# Patient Record
Sex: Female | Born: 2000 | Race: Black or African American | Hispanic: No | Marital: Single | Smoking: Never smoker
Health system: Southern US, Community
[De-identification: ages and names within clinical notes are randomized; demographics above are authoritative.]

## PROBLEM LIST (undated history)

## (undated) DIAGNOSIS — J45909 Unspecified asthma, uncomplicated: Secondary | ICD-10-CM

## (undated) DIAGNOSIS — K219 Gastro-esophageal reflux disease without esophagitis: Secondary | ICD-10-CM

## (undated) DIAGNOSIS — J302 Other seasonal allergic rhinitis: Secondary | ICD-10-CM

## (undated) HISTORY — PX: BACK SURGERY: SHX140

---
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
## 2020-01-01 IMAGING — CR DG CHEST 2V
2 series · 2 of 2 positions shown · non-contrast
Comparison: Radiograph July 27, 2018

CLINICAL DATA: Chest pain

EXAM:
CHEST - 2 VIEW

[chest pa]
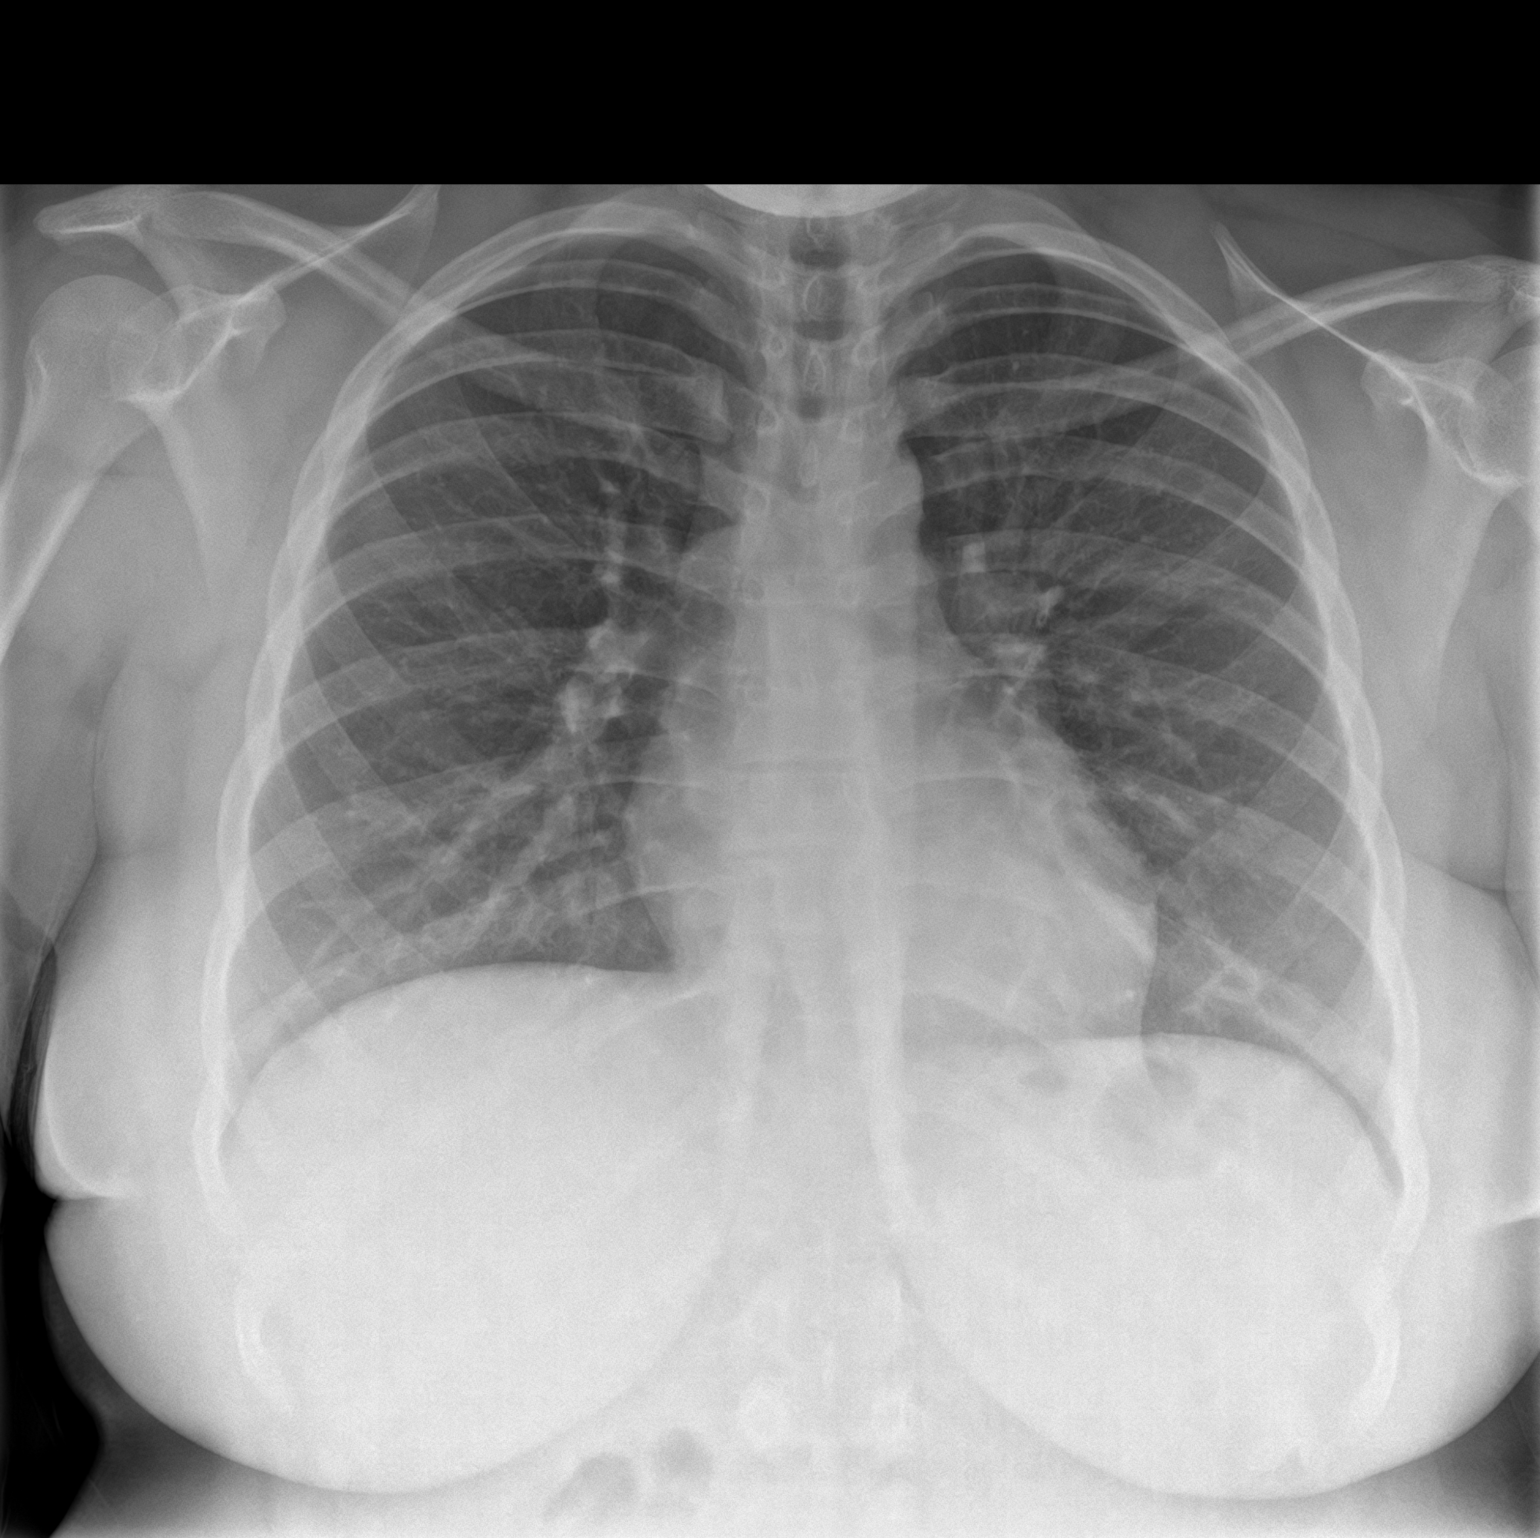

[chest lat]
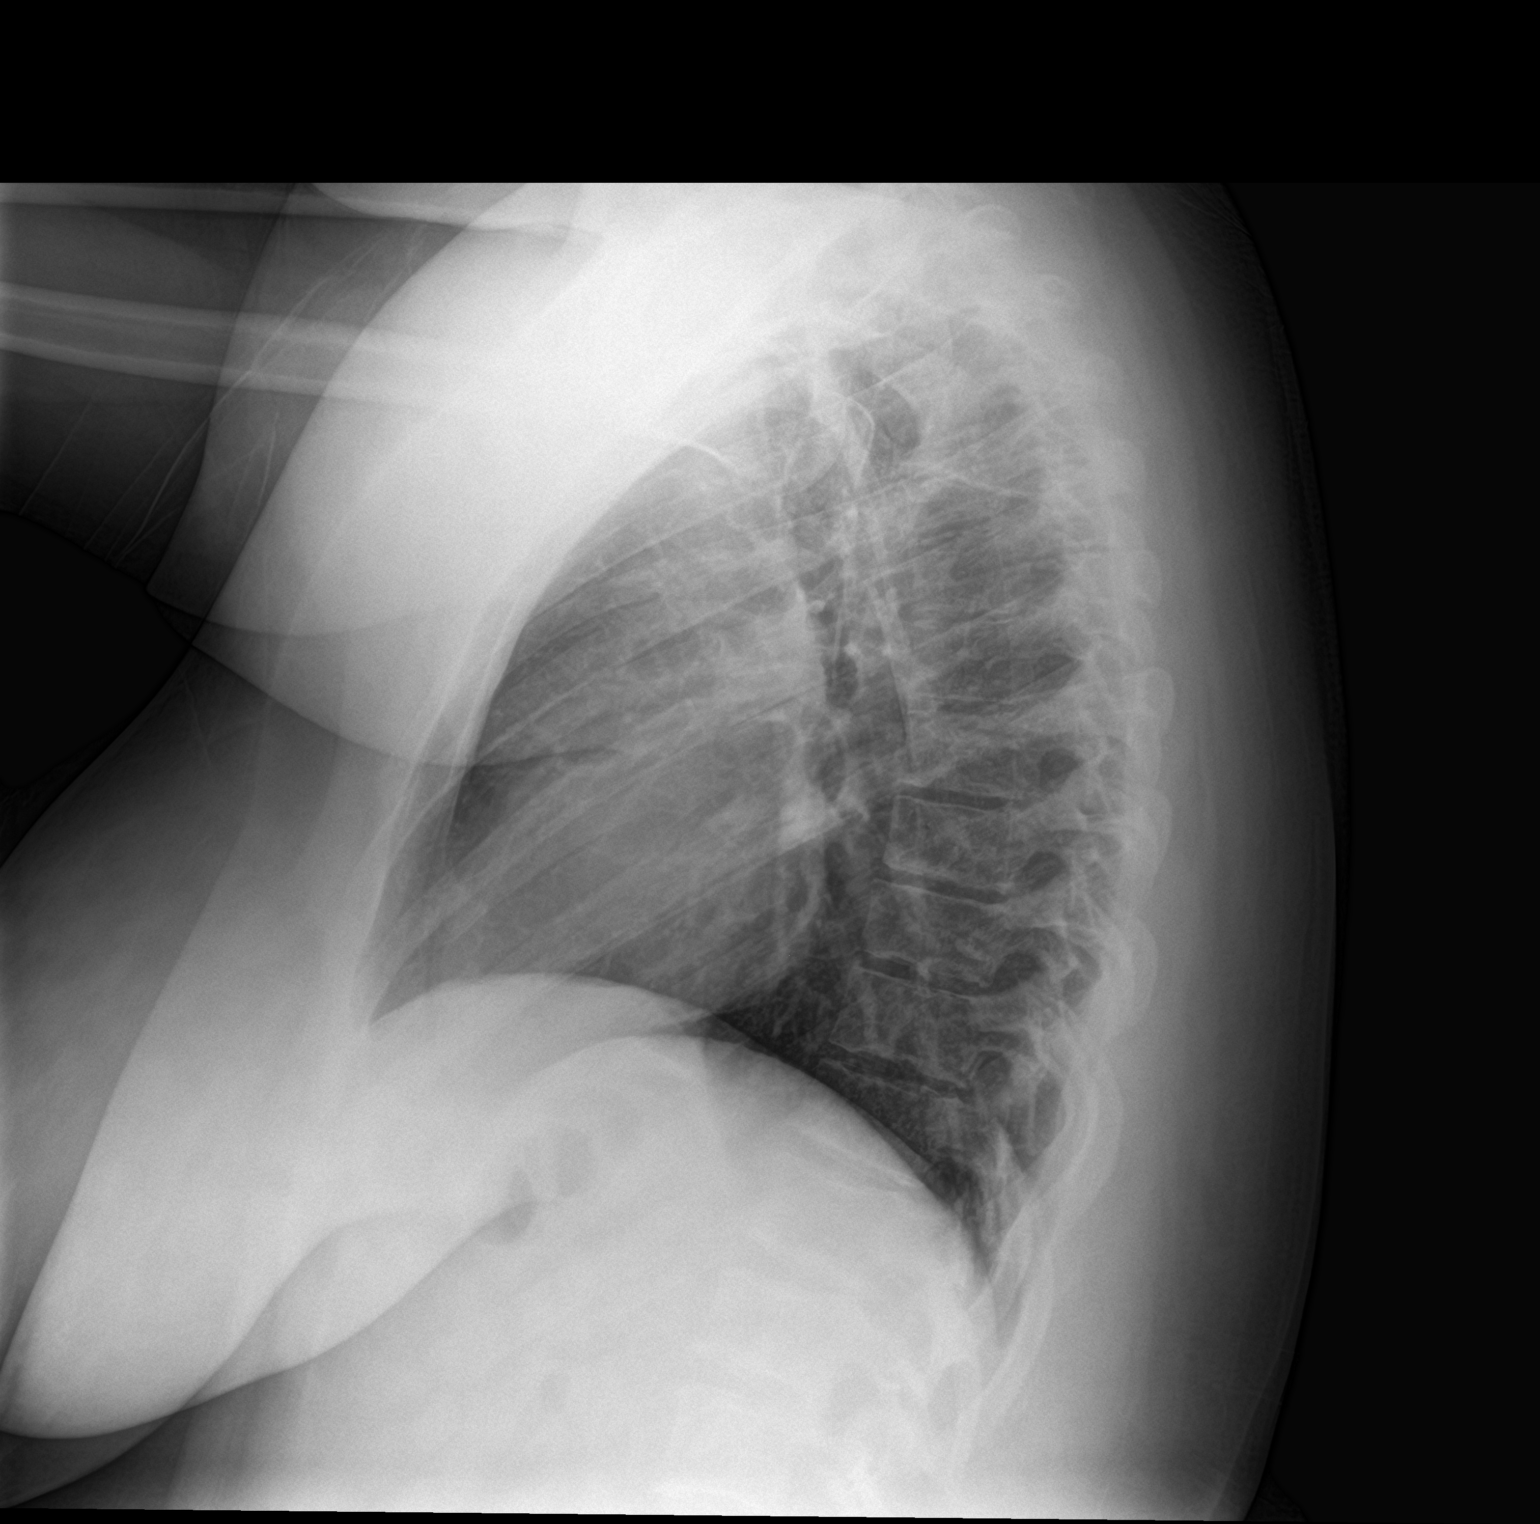

[2 of 2 positions shown; findings below may reference images not displayed]

FINDINGS: No consolidation, features of edema, pneumothorax, or effusion.
Pulmonary vascularity is normally distributed. The cardiomediastinal
contours are unremarkable. No acute osseous or soft tissue
abnormality.
IMPRESSION: No acute cardiopulmonary abnormality.

## 2020-01-06 ENCOUNTER — Other Ambulatory Visit: Payer: Self-pay

## 2020-01-06 ENCOUNTER — Ambulatory Visit
Admission: EM | Admit: 2020-01-06 | Discharge: 2020-01-06 | Disposition: A | Discharge status: Home / Self Care | Payer: Medicaid Other | Attending: Family Medicine | Admitting: Family Medicine

## 2020-01-06 DIAGNOSIS — Z1152 Encounter for screening for COVID-19: Secondary | ICD-10-CM

## 2020-01-06 DIAGNOSIS — B349 Viral infection, unspecified: Secondary | ICD-10-CM | POA: Diagnosis not present

## 2020-01-06 DIAGNOSIS — R110 Nausea: Secondary | ICD-10-CM

## 2020-01-06 MED ORDER — HYDROXYZINE HCL 10 MG PO TABS: 10.0000 mg | ORAL_TABLET | Freq: Three times a day (TID) | ORAL | 0 refills | Status: DC | PRN

## 2020-01-06 MED ORDER — ONDANSETRON 4 MG PO TBDP: 4.0000 mg | ORAL_TABLET | Freq: Three times a day (TID) | ORAL | 0 refills | Status: DC | PRN

## 2020-01-06 NOTE — Discharge Instructions (Addendum)
Medication sent to the pharmacy.  Take as prescribed.  Your Covid test is pending. Follow up as needed for continued or worsening symptoms

## 2020-01-07 NOTE — ED Provider Notes (Signed)
[name]    CSN: [id number] Arrival date & time: 01/06/20  1327      History   Chief Complaint Chief Complaint  Patient presents with  . Covid Exposure  . Nausea    HPI Janice Fry is a 20 y.o. female.   Patient is a 20 year old female who presents today with complaints of nausea, nasal congestion, headache.  Symptoms have been constant for the past couple days.  Was exposed to Covid.  Low-grade fever here today.  Mild chills and body aches.     Past Medical History:  Diagnosis Date  . GERD (gastroesophageal reflux disease)   . Seasonal allergies     There are no problems to display for this patient.   Past Surgical History:  Procedure Laterality Date  . BACK SURGERY      OB History   No obstetric history on file.      Home Medications    Prior to Admission medications   Medication Sig Start Date End Date Taking? Authorizing Provider  ondansetron (ZOFRAN ODT) 4 MG disintegrating tablet Take 1 tablet (4 mg total) by mouth every 8 (eight) hours as needed for nausea or vomiting. 01/06/20  Yes Tildon Silveria A, NP  acetaminophen (TYLENOL) 500 MG tablet Take 1 tablet (500 mg total) by mouth every 6 (six) hours as needed. 12/12/18   Lamptey, Britta Mccreedy, MD  cyclobenzaprine (FLEXERIL) 10 MG tablet Take 1 tablet (10 mg total) by mouth 2 (two) times daily as needed for muscle spasms. 03/12/19   Mickie Bail, NP  hydrOXYzine (ATARAX/VISTARIL) 10 MG tablet Take 1 tablet (10 mg total) by mouth every 8 (eight) hours as needed. 01/06/20   Dahlia Byes A, NP  ibuprofen (ADVIL) 800 MG tablet Take 1 tablet (800 mg total) by mouth every 8 (eight) hours as needed. 03/12/19   Mickie Bail, NP  pantoprazole (PROTONIX) 40 MG tablet Take 1 tablet (40 mg total) by mouth daily. 07/27/18 07/27/19  Triplett, Rulon Eisenmenger B, FNP  cetirizine (ZYRTEC) 10 MG tablet Take 10 mg by mouth daily.  12/12/18  [provider]    Family History Family History  Problem Relation Age of Onset   . Healthy Mother   . Healthy Father     Social History Social History   Tobacco Use  . Smoking status: Never Smoker  . Smokeless tobacco: Never Used  Vaping Use  . Vaping Use: Never used  Substance Use Topics  . Alcohol use: No  . Drug use: Not Currently    Types: Marijuana    Comment: last use 2020     Allergies   Amoxicillin, Strawberry extract, Bee pollen, Nickel, Peanut oil, and Penicillin g   Review of Systems Review of Systems   Physical Exam Triage Vital Signs ED Triage Vitals  Enc Vitals Group     BP 01/06/20 1624 119/81     Pulse Rate 01/06/20 1624 (!) 105     Resp 01/06/20 1624 18     Temp 01/06/20 1624 99.2 F (37.3 C)     Temp src --      SpO2 01/06/20 1624 96 %     Weight --      Height --      Head Circumference --      Peak Flow --      Pain Score 01/06/20 1625 5     Pain Loc --      Pain Edu? --      Excl. in  GC? --    No data found.  Updated Vital Signs BP 119/81   Pulse (!) 105   Temp 99.2 F (37.3 C)   Resp 18   LMP 12/20/2019   SpO2 96%   Visual Acuity Right Eye Distance:   Left Eye Distance:   Bilateral Distance:    Right Eye Near:   Left Eye Near:    Bilateral Near:     Physical Exam Vitals and nursing note reviewed.  Constitutional:      General: She is not in acute distress.    Appearance: Normal appearance. She is not ill-appearing, toxic-appearing or diaphoretic.  HENT:     Head: Normocephalic.     Nose: Nose normal.  Eyes:     Conjunctiva/sclera: Conjunctivae normal.  Pulmonary:     Effort: Pulmonary effort is normal.  Musculoskeletal:        General: Normal range of motion.     Cervical back: Normal range of motion.  Skin:    General: Skin is warm and dry.     Findings: No rash.  Neurological:     Mental Status: She is alert.  Psychiatric:        Mood and Affect: Mood normal.      UC Treatments / Results  Labs (all labs ordered are listed, but only abnormal results are displayed) Labs  Reviewed  NOVEL CORONAVIRUS, NAA    EKG   Radiology No results found.  Procedures Procedures (including critical care time)  Medications Ordered in UC Medications - No data to display  Initial Impression / Assessment and Plan / UC Course  I have reviewed the triage vital signs and the nursing notes.  Pertinent labs & imaging results that were available during my care of the patient were reviewed by me and considered in my medical decision making (see chart for details).     Viral illness with exposure to Covid  Covid and flu test pending. Prescribing Zofran for nausea, vomiting as needed. Refilled her hydroxyzine as requested Final Clinical Impressions(s) / UC Diagnoses   Final diagnoses:  Viral illness  Nausea without vomiting     Discharge Instructions     Medication sent to the pharmacy.  Take as prescribed.  Your Covid test is pending. Follow up as needed for continued or worsening symptoms     ED Prescriptions    Medication Sig Dispense Auth. Provider   hydrOXYzine (ATARAX/VISTARIL) 10 MG tablet Take 1 tablet (10 mg total) by mouth every 8 (eight) hours as needed. 30 tablet Deon Duer A, NP   ondansetron (ZOFRAN ODT) 4 MG disintegrating tablet Take 1 tablet (4 mg total) by mouth every 8 (eight) hours as needed for nausea or vomiting. 20 tablet Dahlia Byes A, NP     PDMP not reviewed this encounter.   Janace Aris, NP 01/07/20 [id number]

## 2020-01-08 LAB — SARS-COV-2, NAA 2 DAY TAT

## 2020-01-08 LAB — NOVEL CORONAVIRUS, NAA: SARS-CoV-2, NAA: DETECTED — AB

## 2020-02-02 ENCOUNTER — Ambulatory Visit
Admission: EM | Admit: 2020-02-02 | Discharge: 2020-02-02 | Disposition: A | Discharge status: Home / Self Care | Payer: Medicaid Other | Attending: Internal Medicine | Admitting: Internal Medicine

## 2020-02-02 ENCOUNTER — Other Ambulatory Visit: Payer: Self-pay

## 2020-02-02 DIAGNOSIS — J208 Acute bronchitis due to other specified organisms: Secondary | ICD-10-CM | POA: Diagnosis not present

## 2020-02-02 MED ORDER — PREDNISONE 50 MG PO TABS: ORAL_TABLET | ORAL | 0 refills | Status: DC

## 2020-02-02 MED ORDER — ALBUTEROL SULFATE HFA 108 (90 BASE) MCG/ACT IN AERS: 2.0000 | INHALATION_SPRAY | RESPIRATORY_TRACT | 0 refills | Status: DC | PRN

## 2020-02-02 NOTE — ED Triage Notes (Addendum)
Pt reports that she was COVID positive approx 4 weeks ago. C/o persistent dry cough since then, worsening. States cough worse upon waking. No OTC meds for symptoms  Denies fever, n/v/d, SOB.  Faint insp wheeze right base, otherwise CTA lung fields.

## 2020-02-02 NOTE — Discharge Instructions (Signed)
Work on stop smoking since this is making you worse You do not have a bacterial infection, but viral which does not respond to antibiotics I am placing you on prednisone to help the breathing pipes inflammation and Albuterol inhaler for wheezing and coughing fits. If you get worse with a fever of 100.5 or more, could be you have developed a bacterial infection and you need to come back or see your family Dr.

## 2020-02-02 NOTE — ED Provider Notes (Signed)
Janice Fry    CSN: [id number] Arrival date & time: 02/02/20  0840      History   Chief Complaint Chief Complaint  Patient presents with  . Cough    HPI Janice Fry is a 20 y.o. female who presents with persistent cough from having Covid on 1/5. Lately has noticed having wheezing and coughing fits first thing in the am and then a couple of more times in the am. She is a smoker. Denies hx of asthma. Denies fever, SOB or sweats. Cough is non productive.     Past Medical History:  Diagnosis Date  . GERD (gastroesophageal reflux disease)   . Seasonal allergies     There are no problems to display for this patient.   Past Surgical History:  Procedure Laterality Date  . BACK SURGERY      OB History   No obstetric history on file.      Home Medications    Prior to Admission medications   Medication Sig Start Date End Date Taking? Authorizing Provider  albuterol (VENTOLIN HFA) 108 (90 Base) MCG/ACT inhaler Inhale 2 puffs into the lungs every 4 (four) hours as needed for wheezing or shortness of breath. 02/02/20  Yes Rodriguez-Southworth, Nettie Elm, PA-C  hydrOXYzine (ATARAX/VISTARIL) 10 MG tablet Take 1 tablet (10 mg total) by mouth every 8 (eight) hours as needed. 01/06/20  Yes Bast, Traci A, NP  pantoprazole (PROTONIX) 40 MG tablet Take 1 tablet (40 mg total) by mouth daily. 07/27/18 07/27/19 Yes Triplett, Cari B, FNP  predniSONE (DELTASONE) 50 MG tablet One q am x 5 days for bronchial inflammation 02/02/20  Yes Rodriguez-Southworth, Nettie Elm, PA-C  acetaminophen (TYLENOL) 500 MG tablet Take 1 tablet (500 mg total) by mouth every 6 (six) hours as needed. 12/12/18   Lamptey, Britta Mccreedy, MD  cyclobenzaprine (FLEXERIL) 10 MG tablet Take 1 tablet (10 mg total) by mouth 2 (two) times daily as needed for muscle spasms. 03/12/19   Mickie Bail, NP  ibuprofen (ADVIL) 800 MG tablet Take 1 tablet (800 mg total) by mouth every 8 (eight) hours as needed. 03/12/19   Mickie Bail, NP   ondansetron (ZOFRAN ODT) 4 MG disintegrating tablet Take 1 tablet (4 mg total) by mouth every 8 (eight) hours as needed for nausea or vomiting. 01/06/20   Dahlia Byes A, NP  cetirizine (ZYRTEC) 10 MG tablet Take 10 mg by mouth daily.  12/12/18  [provider]    Family History Family History  Problem Relation Age of Onset  . Healthy Mother   . Healthy Father     Social History Social History   Tobacco Use  . Smoking status: Never Smoker  . Smokeless tobacco: Never Used  Vaping Use  . Vaping Use: Never used  Substance Use Topics  . Alcohol use: No  . Drug use: Not Currently    Types: Marijuana    Comment: last use 2020     Allergies   Amoxicillin, Strawberry extract, Bee pollen, Nickel, Peanut oil, and Penicillin g   Review of Systems Review of Systems  Constitutional: Negative for chills, diaphoresis, fatigue and fever.  HENT: Positive for postnasal drip. Negative for congestion, ear pain, facial swelling, rhinorrhea and sore throat.   Eyes: Negative for discharge.  Respiratory: Positive for cough and wheezing. Negative for chest tightness and shortness of breath.   Cardiovascular: Negative for chest pain.  Musculoskeletal: Negative for myalgias.  Skin: Negative for rash.  Neurological: Negative for headaches.  Hematological: Negative  for adenopathy.     Physical Exam Triage Vital Signs ED Triage Vitals  Enc Vitals Group     BP 02/02/20 0853 131/84     Pulse Rate 02/02/20 0853 84     Resp 02/02/20 0853 18     Temp 02/02/20 0853 98.2 F (36.8 C)     Temp Source 02/02/20 0853 Oral     SpO2 02/02/20 0853 97 %     Weight 02/02/20 0851 214 lb (97.1 kg)     Height 02/02/20 0851 5\' 4"  (1.626 m)     Head Circumference --      Peak Flow --      Pain Score 02/02/20 0850 0     Pain Loc --      Pain Edu? --      Excl. in GC? --    No data found.  Updated Vital Signs BP 131/84 (BP Location: Left Wrist)   Pulse 84   Temp 98.2 F (36.8 C) (Oral)    Resp 18   Ht 5\' 4"  (1.626 m)   Wt 214 lb (97.1 kg)   LMP 01/15/2020 (Approximate)   SpO2 97%   BMI 36.73 kg/m   Visual Acuity Right Eye Distance:   Left Eye Distance:   Bilateral Distance:    Right Eye Near:   Left Eye Near:    Bilateral Near:     Physical Exam Physical Exam Constitutional:      General: He is not in acute distress.    Appearance: He is not toxic-appearing.  HENT:     Head: Normocephalic.     Right Ear: Tympanic membrane, ear canal and external ear normal.     Left Ear: Ear canal and external ear normal.     Nose: Nose normal.     Mouth/Throat:     Mouth: Mucous membranes are moist.     Pharynx: Oropharynx is clear.  Eyes:     General: No scleral icterus.    Conjunctiva/sclera: Conjunctivae normal.  Cardiovascular:     Rate and Rhythm: Normal rate and regular rhythm.     Heart sounds: No murmur heard.   Pulmonary:     Effort: Pulmonary effort is normal. No respiratory distress.     Breath sounds: Wheezing present at end of inspiration and exhalation on bases.       Musculoskeletal:        General: Normal range of motion.     Cervical back: Neck supple.  Lymphadenopathy:     Cervical: No cervical adenopathy.  Skin:    General: Skin is warm and dry.     Findings: No rash.  Neurological:     Mental Status: He is alert and oriented to person, place, and time.     Gait: Gait normal.  Psychiatric:        Mood and Affect: Mood normal.        Behavior: Behavior normal.        Thought Content: Thought content normal.        Judgment: Judgment normal.     UC Treatments / Results  Labs (all labs ordered are listed, but only abnormal results are displayed) Labs Reviewed - No data to display  EKG   Radiology No results found.  Procedures Procedures (including critical care time)  Medications Ordered in UC Medications - No data to display  Initial Impression / Assessment and Plan / UC Course  I have reviewed the triage vital signs and  the nursing  notes. She has viral bronchitis and I placed her on Albuterol inhaler and prednisone as noted. I educated her about symptoms of  Bacterial bronchitis. See instructions.   Final Clinical Impressions(s) / UC Diagnoses   Final diagnoses:  Viral bronchitis     Discharge Instructions     Work on stop smoking since this is making you worse You do not have a bacterial infection, but viral which does not respond to antibiotics I am placing you on prednisone to help the breathing pipes inflammation and Albuterol inhaler for wheezing and coughing fits. If you get worse with a fever of 100.5 or more, could be you have developed a bacterial infection and you need to come back or see your family Dr.    ED Prescriptions    Medication Sig Dispense Auth. Provider   albuterol (VENTOLIN HFA) 108 (90 Base) MCG/ACT inhaler Inhale 2 puffs into the lungs every 4 (four) hours as needed for wheezing or shortness of breath. 18 g Rodriguez-Southworth, Nettie Elm, PA-C   predniSONE (DELTASONE) 50 MG tablet One q am x 5 days for bronchial inflammation 5 tablet Rodriguez-Southworth, Nettie Elm, PA-C     PDMP not reviewed this encounter.   Garey Ham, [place] 02/02/20 1610

## 2020-02-21 ENCOUNTER — Emergency Department
Admission: EM | Admit: 2020-02-21 | Discharge: 2020-02-21 | Disposition: A | Discharge status: Home / Self Care | Payer: Medicaid Other | Attending: Emergency Medicine | Admitting: Emergency Medicine

## 2020-02-21 ENCOUNTER — Emergency Department: Payer: Medicaid Other

## 2020-02-21 ENCOUNTER — Other Ambulatory Visit: Payer: Self-pay

## 2020-02-21 DIAGNOSIS — R0602 Shortness of breath: Secondary | ICD-10-CM

## 2020-02-21 DIAGNOSIS — Z8616 Personal history of COVID-19: Secondary | ICD-10-CM | POA: Insufficient documentation

## 2020-02-21 DIAGNOSIS — J40 Bronchitis, not specified as acute or chronic: Secondary | ICD-10-CM | POA: Diagnosis not present

## 2020-02-21 DIAGNOSIS — Z91010 Allergy to peanuts: Secondary | ICD-10-CM | POA: Diagnosis not present

## 2020-02-21 LAB — CBC WITH DIFFERENTIAL/PLATELET
Abs Immature Granulocytes: 0.04 10*3/uL (ref 0.00–0.07)
Basophils Absolute: 0 10*3/uL (ref 0.0–0.1)
Basophils Relative: 0 %
Eosinophils Absolute: 1.1 10*3/uL — ABNORMAL HIGH (ref 0.0–0.5)
Eosinophils Relative: 9 %
HCT: 41 % (ref 36.0–46.0)
Hemoglobin: 13.5 g/dL (ref 12.0–15.0)
Immature Granulocytes: 0 %
Lymphocytes Relative: 41 %
Lymphs Abs: 4.8 10*3/uL — ABNORMAL HIGH (ref 0.7–4.0)
MCH: 26.3 pg (ref 26.0–34.0)
MCHC: 32.9 g/dL (ref 30.0–36.0)
MCV: 79.9 fL — ABNORMAL LOW (ref 80.0–100.0)
Monocytes Absolute: 0.6 10*3/uL (ref 0.1–1.0)
Monocytes Relative: 5 %
Neutro Abs: 5.1 10*3/uL (ref 1.7–7.7)
Neutrophils Relative %: 45 %
Platelets: 355 10*3/uL (ref 150–400)
RBC: 5.13 MIL/uL — ABNORMAL HIGH (ref 3.87–5.11)
RDW: 13.4 % (ref 11.5–15.5)
WBC: 11.7 10*3/uL — ABNORMAL HIGH (ref 4.0–10.5)
nRBC: 0 % (ref 0.0–0.2)

## 2020-02-21 LAB — COMPREHENSIVE METABOLIC PANEL
ALT: 16 U/L (ref 0–44)
AST: 17 U/L (ref 15–41)
Albumin: 4 g/dL (ref 3.5–5.0)
Alkaline Phosphatase: 66 U/L (ref 38–126)
Anion gap: 9 (ref 5–15)
BUN: 19 mg/dL (ref 6–20)
CO2: 24 mmol/L (ref 22–32)
Calcium: 9.1 mg/dL (ref 8.9–10.3)
Chloride: 104 mmol/L (ref 98–111)
Creatinine, Ser: 1.04 mg/dL — ABNORMAL HIGH (ref 0.44–1.00)
GFR, Estimated: >60 mL/min (ref >60)
Glucose, Bld: 101 mg/dL — ABNORMAL HIGH (ref 70–99)
Potassium: 3.9 mmol/L (ref 3.5–5.1)
Sodium: 137 mmol/L (ref 135–145)
Total Bilirubin: 0.6 mg/dL (ref 0.3–1.2)
Total Protein: 7.8 g/dL (ref 6.5–8.1)

## 2020-02-21 LAB — TROPONIN I (HIGH SENSITIVITY): Troponin I (High Sensitivity): 4 ng/L (ref <18)

## 2020-02-21 LAB — D-DIMER, QUANTITATIVE: D-Dimer, Quant: <0.27 ug/mL-FEU (ref 0.00–0.50)

## 2020-02-21 MED ORDER — ALBUTEROL SULFATE HFA 108 (90 BASE) MCG/ACT IN AERS: 2.0000 | INHALATION_SPRAY | Freq: Four times a day (QID) | RESPIRATORY_TRACT | 0 refills | Status: AC | PRN

## 2020-02-21 NOTE — ED Notes (Signed)
First rn note: pt states has been ill with URI symptoms for two weeks. Pt states she was tested for covid but was negative, pt states feels continued shob dispite inhalers. Pt appears in no acute distress, hoarse voice noted.

## 2020-02-21 NOTE — ED Triage Notes (Signed)
Pt coming from home , C/C SOB  For aprox 1 month . Recently diagnosed with bronchitis 2 weeks ago , given prednisone/Inhaler.  Persistent dry cough.

## 2020-02-21 NOTE — ED Provider Notes (Signed)
[facility] Emergency Department Provider Note   ____________________________________________   Event Date/Time   First MD Initiated Contact with Patient 02/21/20 2051     (approximate)  I have reviewed the triage vital signs and the nursing notes.   HISTORY  Chief Complaint Shortness of Breath (SOB ongoing 3xweeks )    HPI Janice Fry is a 20 y.o. female with past medical history of allergies and GERD who presents to the ED with cough and shortness of breath.  Patient reports she was diagnosed with COVID-19 about a month and a half ago but continues to have a nonproductive cough.  This is been associated with shortness of breath and she has noticed some pain along her right chest wall for the past couple of days.  She describes this pain as sharp and worse when she goes to cough.  She has not noticed any pain or swelling in her legs and does not take any hormonal medications.  She was diagnosed with bronchitis by her PCP and started on steroids as well as an inhaler, but symptoms have persisted.  She denies any history of DVT/PE, denies any chest pain at rest.        Past Medical History:  Diagnosis Date  . GERD (gastroesophageal reflux disease)   . Seasonal allergies     There are no problems to display for this patient.   Past Surgical History:  Procedure Laterality Date  . BACK SURGERY      Prior to Admission medications   Medication Sig Start Date End Date Taking? Authorizing Provider  albuterol (VENTOLIN HFA) 108 (90 Base) MCG/ACT inhaler Inhale 2 puffs into the lungs every 6 (six) hours as needed for wheezing or shortness of breath. 02/21/20  Yes Chesley Noon, MD  acetaminophen (TYLENOL) 500 MG tablet Take 1 tablet (500 mg total) by mouth every 6 (six) hours as needed. 12/12/18   Lamptey, Britta Mccreedy, MD  cyclobenzaprine (FLEXERIL) 10 MG tablet Take 1 tablet (10 mg total) by mouth 2 (two) times daily as needed for muscle spasms. 03/12/19    Mickie Bail, NP  hydrOXYzine (ATARAX/VISTARIL) 10 MG tablet Take 1 tablet (10 mg total) by mouth every 8 (eight) hours as needed. 01/06/20   Dahlia Byes A, NP  ibuprofen (ADVIL) 800 MG tablet Take 1 tablet (800 mg total) by mouth every 8 (eight) hours as needed. 03/12/19   Mickie Bail, NP  ondansetron (ZOFRAN ODT) 4 MG disintegrating tablet Take 1 tablet (4 mg total) by mouth every 8 (eight) hours as needed for nausea or vomiting. 01/06/20   Dahlia Byes A, NP  pantoprazole (PROTONIX) 40 MG tablet Take 1 tablet (40 mg total) by mouth daily. 07/27/18 07/27/19  Chinita Pester, FNP  predniSONE (DELTASONE) 50 MG tablet One q am x 5 days for bronchial inflammation 02/02/20   Rodriguez-Southworth, Nettie Elm, PA-C  cetirizine (ZYRTEC) 10 MG tablet Take 10 mg by mouth daily.  12/12/18  [provider]    Allergies Amoxicillin, Strawberry extract, Bee pollen, Nickel, Peanut oil, and Penicillin g  Family History  Problem Relation Age of Onset  . Healthy Mother   . Healthy Father     Social History Social History   Tobacco Use  . Smoking status: Never Smoker  . Smokeless tobacco: Never Used  Vaping Use  . Vaping Use: Never used  Substance Use Topics  . Alcohol use: No  . Drug use: Not Currently    Types: Marijuana  Comment: last use 2020    Review of Systems  Constitutional: No fever/chills Eyes: No visual changes. ENT: No sore throat. Cardiovascular: Positive for chest pain. Respiratory: Positive for cough and shortness of breath. Gastrointestinal: No abdominal pain.  No nausea, no vomiting.  No diarrhea.  No constipation. Genitourinary: Negative for dysuria. Musculoskeletal: Negative for back pain. Skin: Negative for rash. Neurological: Negative for headaches, focal weakness or numbness.  ____________________________________________   PHYSICAL EXAM:  VITAL SIGNS: ED Triage Vitals [02/21/20 2014]  Enc Vitals Group     BP 125/74     Pulse Rate (!) 106     Resp 17      Temp 98.3 F (36.8 C)     Temp Source Oral     SpO2 99 %     Weight 230 lb (104.3 kg)     Height 5\' 4"  (1.626 m)     Head Circumference      Peak Flow      Pain Score 0     Pain Loc      Pain Edu?      Excl. in [place]?     Constitutional: Alert and oriented. Eyes: Conjunctivae are normal. Head: Atraumatic. Nose: No congestion/rhinnorhea. Mouth/Throat: Mucous membranes are moist. Neck: Normal ROM Cardiovascular: Normal rate, regular rhythm. Grossly normal heart sounds.  2+ radial pulses bilaterally. Respiratory: Normal respiratory effort.  No retractions. Lungs CTAB.  No chest wall tenderness to palpation. Gastrointestinal: Soft and nontender. No distention. Genitourinary: deferred Musculoskeletal: No lower extremity tenderness nor edema. Neurologic:  Normal speech and language. No gross focal neurologic deficits are appreciated. Skin:  Skin is warm, dry and intact. No rash noted. Psychiatric: Mood and affect are normal. Speech and behavior are normal.  ____________________________________________   LABS (all labs ordered are listed, but only abnormal results are displayed)  Labs Reviewed  COMPREHENSIVE METABOLIC PANEL - Abnormal; Notable for the following components:      Result Value   Glucose, Bld 101 (*)    Creatinine, Ser 1.04 (*)    All other components within normal limits  CBC WITH DIFFERENTIAL/PLATELET - Abnormal; Notable for the following components:   WBC 11.7 (*)    RBC 5.13 (*)    MCV 79.9 (*)    Lymphs Abs 4.8 (*)    Eosinophils Absolute 1.1 (*)    All other components within normal limits  D-DIMER, QUANTITATIVE (NOT AT [facility])  TROPONIN I (HIGH SENSITIVITY)   ____________________________________________  EKG  ED ECG REPORT I, [facility], the attending physician, personally viewed and interpreted this ECG.   Date: 02/21/2020  EKG Time: 20:13  Rate: 91  Rhythm: normal sinus rhythm  Axis: Normal  Intervals:none  ST&T Change:  None   PROCEDURES  Procedure(s) performed (including Critical Care):  Procedures   ____________________________________________   INITIAL IMPRESSION / ASSESSMENT AND PLAN / ED COURSE       20 year old female with past medical history of GERD and allergies presents to the ED with ongoing cough and shortness of breath after testing positive for COVID-19 about a month and a half ago, has now developed pleuritic pain to her right chest.  She is mildly tachycardic but vital signs otherwise reassuring.  She is not in any respiratory distress and is maintaining O2 sats on room air.  EKG shows no evidence of arrhythmia or ischemia and labs thus far are unremarkable.  Ongoing symptoms after dealing with COVID-19 is concerning for possible PE, we will further assess with D-dimer as she  is low risk by [place].  Low suspicion for ACS and chest x-ray reviewed by me, shows no infiltrate, edema, or effusion.  D-dimer within normal limits, doubt PE.  Troponin is also negative and I have a very low suspicion for ACS.  Symptoms are likely reflective of ongoing bronchitis and patient is appropriate for discharge home with PCP follow-up.  She was provided with refill for albuterol and counseled to return to the ED for new worsening symptoms, patient agrees with plan.      ____________________________________________   FINAL CLINICAL IMPRESSION(S) / ED DIAGNOSES  Final diagnoses:  Shortness of breath  Bronchitis     ED Discharge Orders         Ordered    albuterol (VENTOLIN HFA) 108 (90 Base) MCG/ACT inhaler  Every 6 hours PRN       Note to Pharmacy: Please supply with spacer   02/21/20 2215           Note:  This document was prepared using Dragon voice recognition software and may include unintentional dictation errors.   Chesley Noon, MD 02/21/20 2216

## 2020-02-25 ENCOUNTER — Ambulatory Visit: Payer: Self-pay | Admitting: *Deleted

## 2020-02-25 NOTE — Telephone Encounter (Signed)
  Patient prescribed medication , cephalexin and she has an allergy to penicillin when she was younger. Unknown what allergy per patient to penicillin. Patient was told by pharmacy that medication ordered was a 3rd generation to penicillin and is unsure if she should take medication. Instructed patient to contact MD who prescribed medication to see if alternative is available and to report pharmacy's information. Instructed patient if she develops fever or increasing symptoms go to ED. Care advise given. Patient verbalized understanding of care advise and to call PCP or provider or go to ED if symptoms worsen.   Reason for Disposition . [1] Caller has URGENT medicine question about med that PCP or specialist prescribed AND [2] triager unable to answer question  Answer Assessment - Initial Assessment Questions 1. NAME of MEDICATION: "What medicine are you calling about?"     cephalaxin 2. QUESTION: "What is your question?" (e.g., medication refill, side effect)     Side effects. Patient reports pharmacist reported patient has allergy to penicillin and requested patient to call dr due to allergy listed.  3. PRESCRIBING HCP: "Who prescribed it?" Reason: if prescribed by specialist, call should be referred to that group.     unknown 4. SYMPTOMS: "Do you have any symptoms?"     Na. Patient has not taken medication at this time. 5. SEVERITY: If symptoms are present, ask "Are they mild, moderate or severe?"     na 6. PREGNANCY:  "Is there any chance that you are pregnant?" "When was your last menstrual period?"     na  Protocols used: MEDICATION QUESTION CALL-A-AH

## 2020-11-16 ENCOUNTER — Ambulatory Visit: Admit: 2020-11-16 | Discharge status: Absent without leave | Payer: Medicaid Other

## 2021-03-02 IMAGING — CR DG CHEST 2V
2 series · 2 of 2 positions shown · non-contrast
Comparison: December 22, 2018

CLINICAL DATA: Shortness of breath and cough.

EXAM:
CHEST - 2 VIEW

[chest pa]
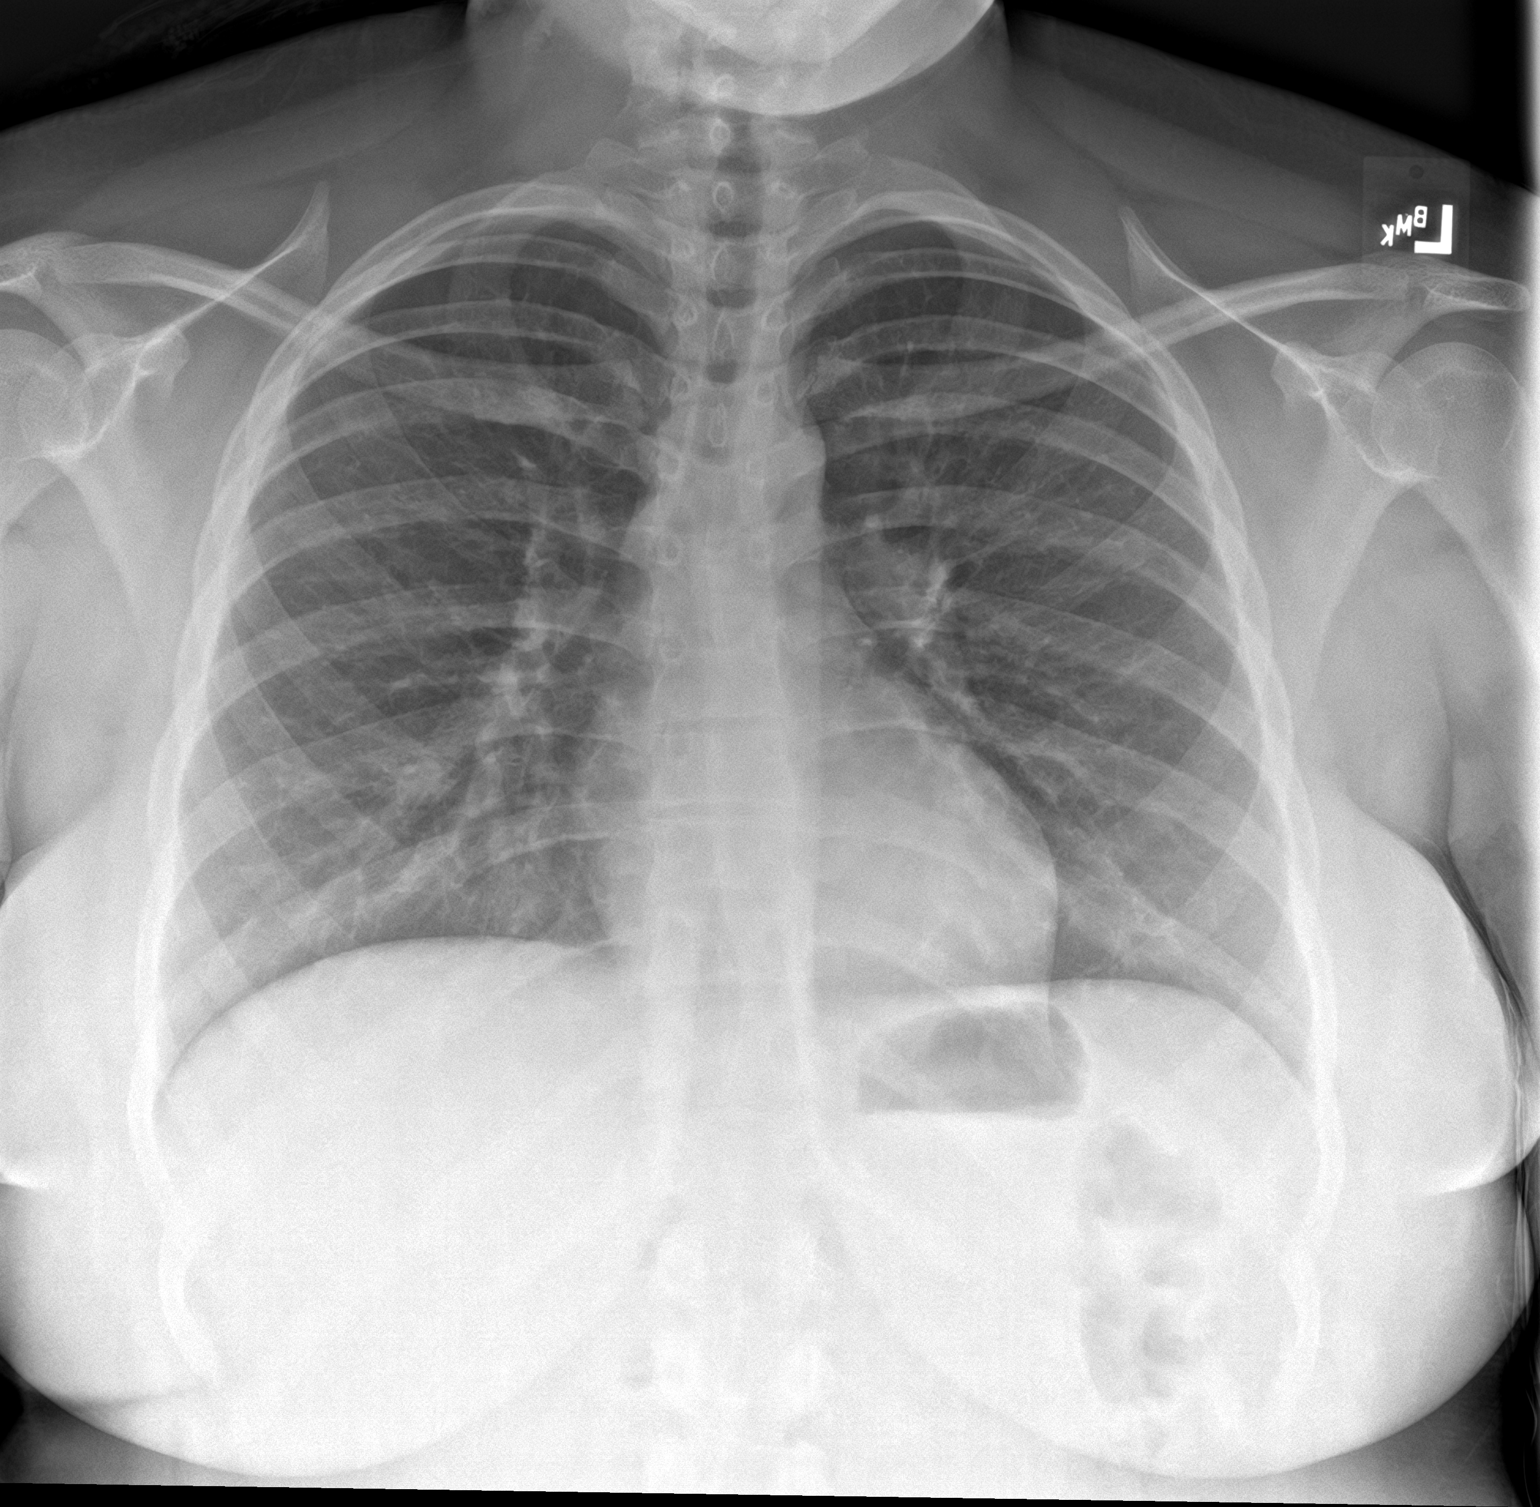

[chest lat]
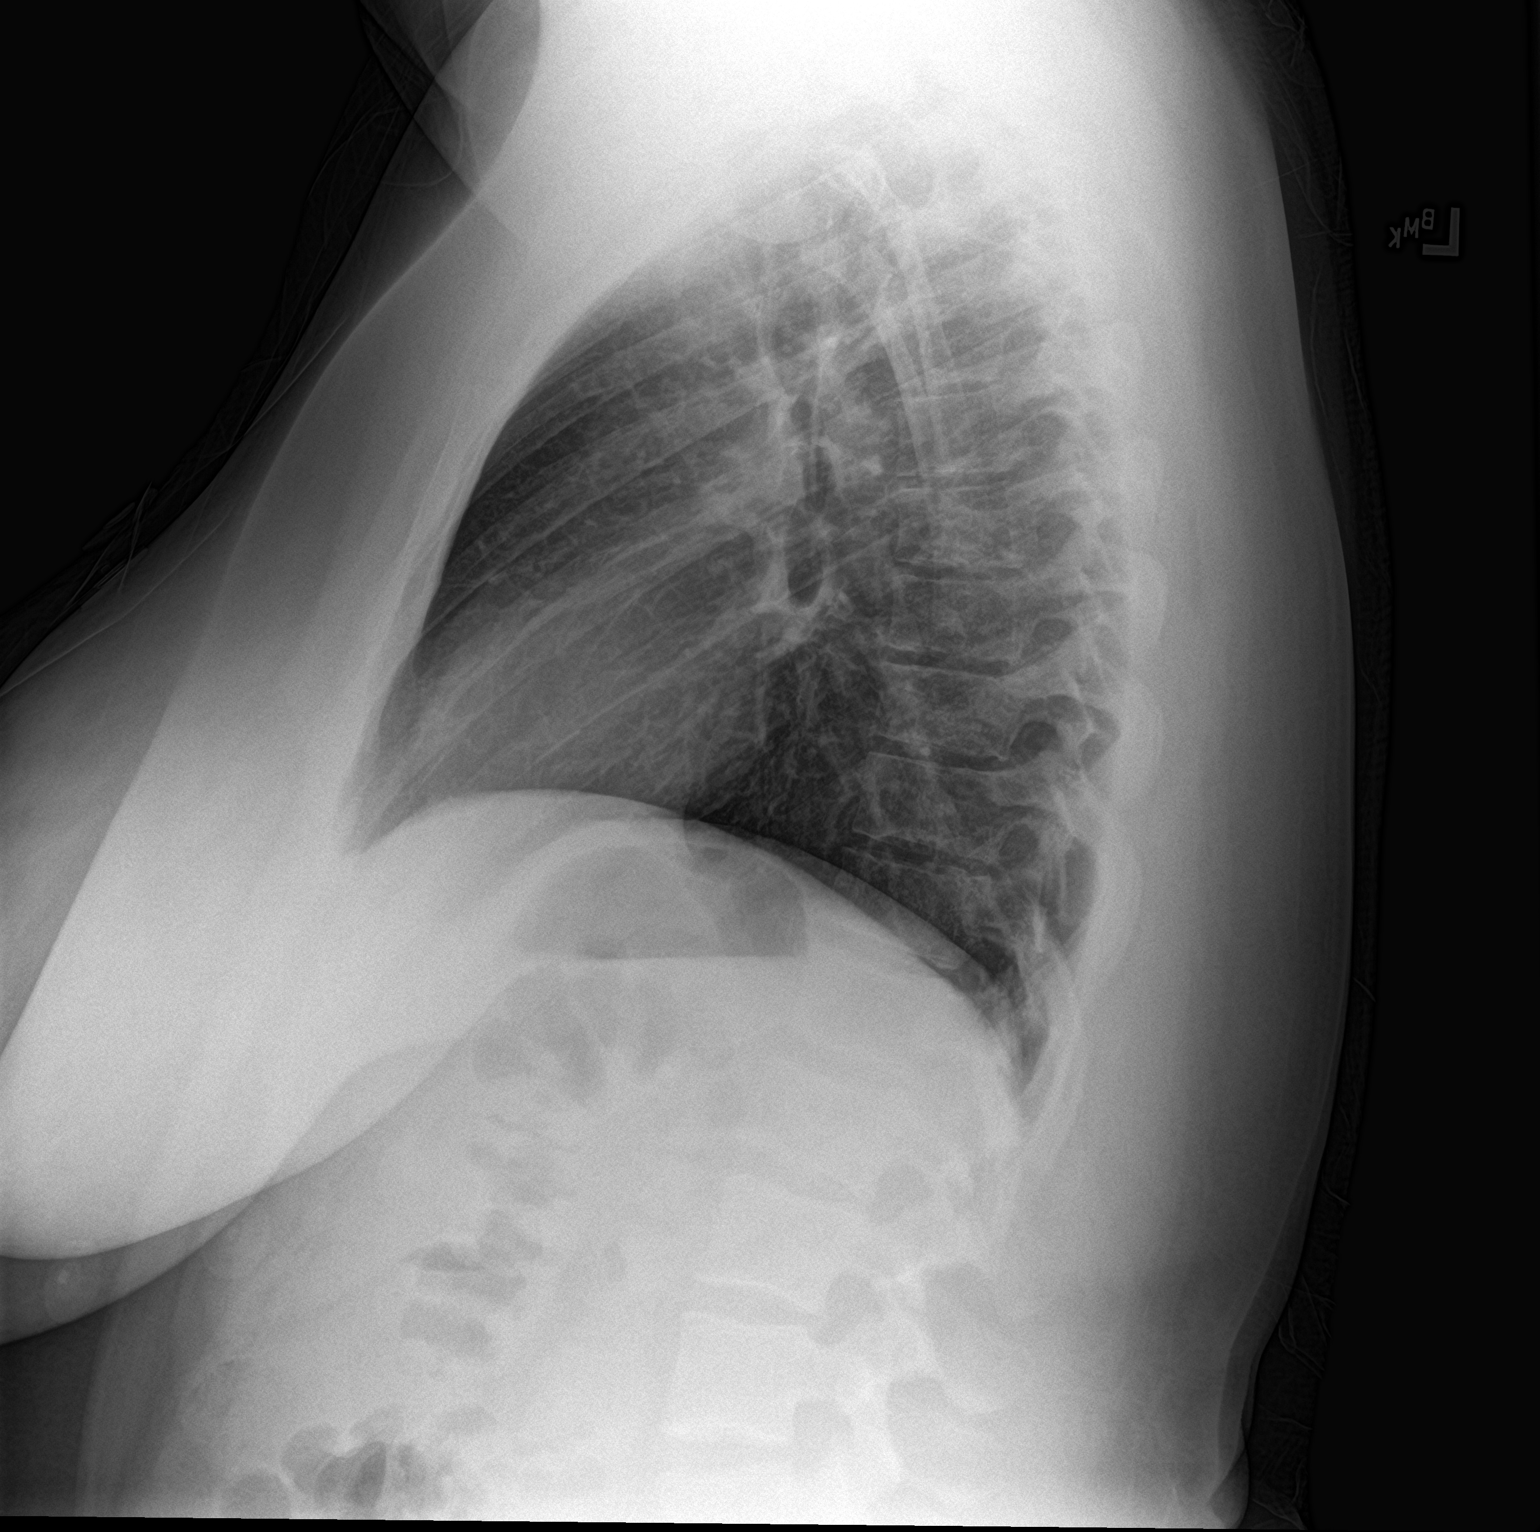

[2 of 2 positions shown; findings below may reference images not displayed]

FINDINGS: The heart size and mediastinal contours are within normal limits.
Both lungs are clear. The visualized skeletal structures are
unremarkable.
IMPRESSION: Stable exam without active cardiopulmonary disease.

## 2022-03-19 ENCOUNTER — Ambulatory Visit
Admission: RE | Admit: 2022-03-19 | Discharge: 2022-03-19 | Disposition: A | Discharge status: Home / Self Care | Payer: Medicaid Other | Source: Ambulatory Visit

## 2022-03-19 VITALS — BP 114/78 | HR 71 | Temp 97.7°F | Ht 64.0 in | Wt 220.0 lb

## 2022-03-19 DIAGNOSIS — S51811A Laceration without foreign body of right forearm, initial encounter: Secondary | ICD-10-CM | POA: Diagnosis not present

## 2022-03-19 DIAGNOSIS — W540XXA Bitten by dog, initial encounter: Secondary | ICD-10-CM

## 2022-03-19 MED ORDER — DOXYCYCLINE HYCLATE 100 MG PO CAPS: 100.0000 mg | ORAL_CAPSULE | Freq: Two times a day (BID) | ORAL | 0 refills | Status: AC

## 2022-03-19 MED ORDER — TETANUS-DIPHTH-ACELL PERTUSSIS 5-2.5-18.5 LF-MCG/0.5 IM SUSY
0.5000 mL | PREFILLED_SYRINGE | Freq: Once | INTRAMUSCULAR | Status: AC
  Administered 2022-03-19: 0.5 mL via INTRAMUSCULAR

## 2022-03-19 NOTE — ED Provider Notes (Signed)
[facility]    CSN: [id number] Arrival date & time: 03/19/22  1413      History   Chief Complaint Chief Complaint  Patient presents with   Animal Bite    RT forearm     HPI Janice Fry is a 22 y.o. female.   HPI  23 year old female with a past medical history significant for GERD and seasonal allergies here for evaluation of a dog bite to her right forearm.  She reports that her neighbors dog attacked her dog while in the patient's yard and while attempting to break them up the neighbors dog bit her on the right forearm.  Per the patient, the neighbors dog's vaccinations are up-to-date and she reports that she has seen the up-to-date paperwork.  She is here today because she is unsure when her last tetanus shot was.  Past Medical History:  Diagnosis Date   GERD (gastroesophageal reflux disease)    Seasonal allergies     There are no problems to display for this patient.   Past Surgical History:  Procedure Laterality Date   BACK SURGERY      OB History   No obstetric history on file.      Home Medications    Prior to Admission medications   Medication Sig Start Date End Date Taking? Authorizing Provider  albuterol (VENTOLIN HFA) 108 (90 Base) MCG/ACT inhaler Inhale 2 puffs into the lungs every 6 (six) hours as needed for wheezing or shortness of breath. 02/21/20  Yes Blake Divine, MD  doxycycline (VIBRAMYCIN) 100 MG capsule Take 1 capsule (100 mg total) by mouth 2 (two) times daily for 7 days. 03/19/22 03/26/22 Yes Margarette Canada, NP  hydrOXYzine (ATARAX/VISTARIL) 10 MG tablet Take 1 tablet (10 mg total) by mouth every 8 (eight) hours as needed. 01/06/20  Yes Bast, Traci A, NP  montelukast (SINGULAIR) 10 MG tablet Take 1 tablet by mouth daily. 07/26/21  Yes [provider]  pantoprazole (PROTONIX) 40 MG tablet Take 1 tablet (40 mg total) by mouth daily. 07/27/18 03/19/22 Yes Triplett, Cari B, FNP  acetaminophen (TYLENOL) 500 MG tablet Take 1  tablet (500 mg total) by mouth every 6 (six) hours as needed. 12/12/18   Lamptey, Myrene Galas, MD  cyclobenzaprine (FLEXERIL) 10 MG tablet Take 1 tablet (10 mg total) by mouth 2 (two) times daily as needed for muscle spasms. 03/12/19   Sharion Balloon, NP  ibuprofen (ADVIL) 800 MG tablet Take 1 tablet (800 mg total) by mouth every 8 (eight) hours as needed. 03/12/19   Sharion Balloon, NP  ondansetron (ZOFRAN ODT) 4 MG disintegrating tablet Take 1 tablet (4 mg total) by mouth every 8 (eight) hours as needed for nausea or vomiting. 01/06/20   Orvan July, NP  predniSONE (DELTASONE) 50 MG tablet One q am x 5 days for bronchial inflammation 02/02/20   Rodriguez-Southworth, Sunday Spillers, PA-C  cetirizine (ZYRTEC) 10 MG tablet Take 10 mg by mouth daily.  12/12/18  [provider]    Family History Family History  Problem Relation Age of Onset   Healthy Mother    Healthy Father     Social History Social History   Tobacco Use   Smoking status: Never   Smokeless tobacco: Never  Vaping Use   Vaping Use: Never used  Substance Use Topics   Alcohol use: No   Drug use: Not Currently    Types: Marijuana    Comment: last use 2020     Allergies  Amoxicillin, Strawberry extract, Bee pollen, Penicillins, Nickel, Peanut oil, and Penicillin g   Review of Systems Review of Systems  Constitutional:  Negative for fever.  Skin:  Positive for color change and wound.     Physical Exam Triage Vital Signs ED Triage Vitals [03/19/22 1426]  Enc Vitals Group     BP      Pulse      Resp      Temp      Temp src      SpO2      Weight 220 lb (99.8 kg)     Height 5\' 4"  (1.626 m)     Head Circumference      Peak Flow      Pain Score 0     Pain Loc      Pain Edu?      Excl. in [place]?    No data found.  Updated Vital Signs BP 114/78 (BP Location: Right Arm)   Pulse 71   Temp 97.7 F (36.5 C) (Oral)   Ht 5\' 4"  (1.626 m)   Wt 220 lb (99.8 kg)   LMP 03/05/2022 (Approximate)   SpO2 98%   BMI 37.76  kg/m   Visual Acuity Right Eye Distance:   Left Eye Distance:   Bilateral Distance:    Right Eye Near:   Left Eye Near:    Bilateral Near:     Physical Exam Vitals and nursing note reviewed.  Musculoskeletal:        General: Tenderness and signs of injury present. Normal range of motion.  Skin:    General: Skin is warm and dry.     Capillary Refill: Capillary refill takes less than 2 seconds.     Findings: Bruising and erythema present.      UC Treatments / Results  Labs (all labs ordered are listed, but only abnormal results are displayed) Labs Reviewed - No data to display  EKG   Radiology No results found.  Procedures Procedures (including critical care time)  Medications Ordered in UC Medications  Tdap (BOOSTRIX) injection 0.5 mL (has no administration in time range)    Initial Impression / Assessment and Plan / UC Course  I have reviewed the triage vital signs and the nursing notes.  Pertinent labs & imaging results that were available during my care of the patient were reviewed by me and considered in my medical decision making (see chart for details).   The patient is a pleasant, nontoxic-appearing 22 year old female here requesting a tetanus shot after sustaining a dog bite 4 days ago from her neighbors dog as outlined in HPI above.  As you can see the image above the bite wound is scabbed with mild erythema and resolving ecchymosis.  It is not hot to touch and there is no drainage.  Per [facility] MyChart patient's last tetanus shot was on 08/10/2011 so I will order an updated Tdap and will discharge to home on doxycycline 100 mg doxycycline 100 mg Doxycycline 100 mg twice daily for 7 days as prophylaxis to infection as patient has an allergy to PCN.   Final Clinical Impressions(s) / UC Diagnoses   Final diagnoses:  Laceration of right forearm, initial encounter  Dog bite, initial encounter     Discharge Instructions      We have updated your tetanus  shot today.  Please keep the wound on your right forearm clean and dry.  Take the doxycycline 100 mg twice daily with food for 7 days to  prevent infection from the dog bite.  If you develop any fever, redness around the bite site, pus drainage from the bite site, or red streaks going up your arm please return for reevaluation.     ED Prescriptions     Medication Sig Dispense Auth. Provider   doxycycline (VIBRAMYCIN) 100 MG capsule Take 1 capsule (100 mg total) by mouth 2 (two) times daily for 7 days. 14 capsule Margarette Canada, NP      PDMP not reviewed this encounter.   Margarette Canada, NP 03/19/22 1440

## 2022-03-19 NOTE — ED Triage Notes (Addendum)
Pt states she was bit by her neighbors dog RT forearm, pt states her neighbors told her dog is up to date on vaccines DOI: 03/15/22. Pt states last tetanus per her [facility] MyChart 08/10/2011

## 2022-03-19 NOTE — Discharge Instructions (Addendum)
We have updated your tetanus shot today.  Please keep the wound on your right forearm clean and dry.  Take the doxycycline 100 mg twice daily with food for 7 days to prevent infection from the dog bite.  If you develop any fever, redness around the bite site, pus drainage from the bite site, or red streaks going up your arm please return for reevaluation.

## 2022-08-29 ENCOUNTER — Ambulatory Visit
Admission: EM | Admit: 2022-08-29 | Discharge: 2022-08-29 | Disposition: A | Discharge status: Home / Self Care | Payer: Medicaid Other | Attending: Emergency Medicine | Admitting: Emergency Medicine

## 2022-08-29 DIAGNOSIS — R768 Other specified abnormal immunological findings in serum: Secondary | ICD-10-CM | POA: Diagnosis not present

## 2022-08-29 DIAGNOSIS — R439 Unspecified disturbances of smell and taste: Secondary | ICD-10-CM | POA: Insufficient documentation

## 2022-08-29 DIAGNOSIS — U071 COVID-19: Secondary | ICD-10-CM | POA: Diagnosis not present

## 2022-08-29 DIAGNOSIS — R0981 Nasal congestion: Secondary | ICD-10-CM | POA: Diagnosis not present

## 2022-08-29 LAB — SARS CORONAVIRUS 2 BY RT PCR: SARS Coronavirus 2 by RT PCR: POSITIVE — AB

## 2022-08-29 MED ORDER — IPRATROPIUM BROMIDE 0.06 % NA SOLN: 2.0000 | Freq: Four times a day (QID) | NASAL | 12 refills | Status: DC

## 2022-08-29 MED ORDER — PSEUDOEPHEDRINE HCL 60 MG PO TABS: 60.0000 mg | ORAL_TABLET | Freq: Four times a day (QID) | ORAL | 0 refills | Status: DC | PRN

## 2022-08-29 MED ORDER — FLUTICASONE PROPIONATE 50 MCG/ACT NA SUSP: 1.0000 | Freq: Two times a day (BID) | NASAL | 1 refills | Status: AC

## 2022-08-29 NOTE — Discharge Instructions (Addendum)
Utilize hot tea with lemon and honey.  Gargle with salt water.  Take medications as ordered.  Return to urgent care or PCP for worsening of symptoms to include shortness of breath.

## 2022-08-29 NOTE — ED Triage Notes (Signed)
Pt c/o nasal congestion & loss of smell/taste x2 days. Denies any fevers. Has tried dayquil & nyquil w/o relief.

## 2022-08-29 NOTE — ED Provider Notes (Addendum)
[facility]    CSN: [id number] Arrival date & time: 08/29/22  0847      History   Chief Complaint Chief Complaint  Patient presents with   Nasal Congestion   Loss of Smell    HPI Janice Fry is a 22 y.o. female. Presents with congestion, loss of smell and taste x 2 days, no fever, mild GI symptoms of intermittent diarrhea .  HPI  Past Medical History:  Diagnosis Date   GERD (gastroesophageal reflux disease)    Seasonal allergies     There are no problems to display for this patient.   Past Surgical History:  Procedure Laterality Date   BACK SURGERY      OB History   No obstetric history on file.      Home Medications    Prior to Admission medications   Medication Sig Start Date End Date Taking? Authorizing Provider  famotidine (PEPCID) 20 MG tablet Take 1 tablet by mouth 2 (two) times daily. 07/11/22 07/11/23 Yes [provider]  fluticasone (FLONASE) 50 MCG/ACT nasal spray Place 1 spray into both nostrils in the morning and at bedtime. 08/29/22  Yes Svetlana Bagby, Linde Gillis, NP  hydrOXYzine (ATARAX/VISTARIL) 10 MG tablet Take 1 tablet (10 mg total) by mouth every 8 (eight) hours as needed. 01/06/20  Yes Bast, Traci A, NP  ipratropium (ATROVENT) 0.06 % nasal spray Place 2 sprays into both nostrils 4 (four) times daily. 08/29/22  Yes Wisdom Seybold, Linde Gillis, NP  loratadine (CLARITIN) 10 MG tablet Take 1 tablet by mouth daily. 07/11/22 07/11/23 Yes [provider]  montelukast (SINGULAIR) 10 MG tablet Take 1 tablet by mouth daily. 07/26/21  Yes [provider]  pseudoephedrine (SUDAFED) 60 MG tablet Take 1 tablet (60 mg total) by mouth every 6 (six) hours as needed for congestion. 08/29/22  Yes Agustina Witzke, Linde Gillis, NP  acetaminophen (TYLENOL) 500 MG tablet Take 1 tablet (500 mg total) by mouth every 6 (six) hours as needed. 12/12/18   Lamptey, Britta Mccreedy, MD  albuterol (VENTOLIN HFA) 108 (90 Base) MCG/ACT inhaler Inhale 2 puffs into the lungs every 6  (six) hours as needed for wheezing or shortness of breath. 02/21/20   Chesley Noon, MD  cyclobenzaprine (FLEXERIL) 10 MG tablet Take 1 tablet (10 mg total) by mouth 2 (two) times daily as needed for muscle spasms. 03/12/19   Mickie Bail, NP  ibuprofen (ADVIL) 800 MG tablet Take 1 tablet (800 mg total) by mouth every 8 (eight) hours as needed. 03/12/19   Mickie Bail, NP  ondansetron (ZOFRAN ODT) 4 MG disintegrating tablet Take 1 tablet (4 mg total) by mouth every 8 (eight) hours as needed for nausea or vomiting. 01/06/20   Dahlia Byes A, NP  pantoprazole (PROTONIX) 40 MG tablet Take 1 tablet (40 mg total) by mouth daily. 07/27/18 03/19/22  Chinita Pester, FNP  predniSONE (DELTASONE) 50 MG tablet One q am x 5 days for bronchial inflammation 02/02/20   Rodriguez-Southworth, Nettie Elm, PA-C  cetirizine (ZYRTEC) 10 MG tablet Take 10 mg by mouth daily.  12/12/18  [provider]    Family History Family History  Problem Relation Age of Onset   Healthy Mother    Healthy Father     Social History Social History   Tobacco Use   Smoking status: Never   Smokeless tobacco: Never  Vaping Use   Vaping status: Never Used  Substance Use Topics   Alcohol use: No   Drug use: Not Currently  Types: Marijuana    Comment: last use 2020     Allergies   Amoxicillin, Strawberry extract, Bee pollen, Penicillins, Nickel, Peanut oil, and Penicillin g   Review of Systems Review of Systems  Constitutional:  Positive for appetite change and fatigue.  HENT:  Positive for congestion, postnasal drip and sore throat.        Reports loss of smell and loss of taste  Gastrointestinal:  Positive for diarrhea and nausea.     Physical Exam Triage Vital Signs ED Triage Vitals  Encounter Vitals Group     BP 08/29/22 0855 111/70     Systolic BP Percentile --      Diastolic BP Percentile --      Pulse Rate 08/29/22 0855 65     Resp 08/29/22 0855 16     Temp 08/29/22 0855 98.4 F (36.9 C)     Temp  Source 08/29/22 0855 Oral     SpO2 08/29/22 0855 98 %     Weight 08/29/22 0853 220 lb (99.8 kg)     Height 08/29/22 0853 5\' 4"  (1.626 m)     Head Circumference --      Peak Flow --      Pain Score 08/29/22 0858 0     Pain Loc --      Pain Education --      Exclude from Growth Chart --    No data found.  Updated Vital Signs BP 111/70 (BP Location: Right Arm)   Pulse 65   Temp 98.4 F (36.9 C) (Oral)   Resp 16   Ht 5\' 4"  (1.626 m)   Wt 220 lb (99.8 kg)   SpO2 98%   BMI 37.76 kg/m   Visual Acuity Right Eye Distance:   Left Eye Distance:   Bilateral Distance:    Right Eye Near:   Left Eye Near:    Bilateral Near:     Physical Exam Constitutional:      Appearance: Normal appearance.  HENT:     Head:     Comments: Clear drainage noted posterior throat    Right Ear: Tympanic membrane normal.     Left Ear: Tympanic membrane normal.     Nose: Congestion present.     Mouth/Throat:     Mouth: Mucous membranes are moist.  Neck:     Comments: No lymphadenopathy Pulmonary:     Effort: Pulmonary effort is normal.     Breath sounds: Normal breath sounds.  Abdominal:     General: Bowel sounds are normal.      UC Treatments / Results  Labs (all labs ordered are listed, but only abnormal results are displayed) Labs Reviewed  SARS CORONAVIRUS 2 BY RT PCR - Abnormal; Notable for the following components:      Result Value   SARS Coronavirus 2 by RT PCR POSITIVE (*)    All other components within normal limits    EKG   Radiology No results found.  Procedures Procedures (including critical care time)  Medications Ordered in UC Medications - No data to display  Initial Impression / Assessment and Plan / UC Course  I have reviewed the triage vital signs and the nursing notes.  Pertinent labs & imaging results that were available during my care of the patient were reviewed by me and considered in my medical decision making (see chart for details).   Patient  has been ill feeling x 2 days, denies any fevers, reports mild GI symptoms.  Recently  in contact with an ill family member who was possibly covid + .  Will run COVID test.   Covid test positive  Education given for mask wearing with viral illnesses.     Final Clinical Impressions(s) / UC Diagnoses   Final diagnoses:  COVID-19 virus antibody detected     Discharge Instructions      Utilize hot tea with lemon and honey.  Gargle with salt water.  Take medications as ordered.  Return to urgent care or PCP for worsening of symptoms to include shortness of breath.     ED Prescriptions     Medication Sig Dispense Auth. Provider   fluticasone (FLONASE) 50 MCG/ACT nasal spray Place 1 spray into both nostrils in the morning and at bedtime. 18.2 mL Jacklynn Dehaas M, NP   ipratropium (ATROVENT) 0.06 % nasal spray Place 2 sprays into both nostrils 4 (four) times daily. 15 mL Samari Bittinger, Linde Gillis, NP   pseudoephedrine (SUDAFED) 60 MG tablet Take 1 tablet (60 mg total) by mouth every 6 (six) hours as needed for congestion. 30 tablet Calypso Hagarty, Linde Gillis, NP      PDMP not reviewed this encounter.   Nelda Marseille, NP 08/29/22 1017    Nelda Marseille, [place] 08/29/22 1031

## 2022-12-10 ENCOUNTER — Ambulatory Visit
Admission: EM | Admit: 2022-12-10 | Discharge: 2022-12-10 | Disposition: A | Discharge status: Home / Self Care | Payer: Medicaid Other | Attending: Family Medicine | Admitting: Family Medicine

## 2022-12-10 DIAGNOSIS — R21 Rash and other nonspecific skin eruption: Secondary | ICD-10-CM

## 2022-12-10 HISTORY — DX: Unspecified asthma, uncomplicated: J45.909

## 2022-12-10 MED ORDER — TRIAMCINOLONE ACETONIDE 0.1 % EX OINT: 1.0000 | TOPICAL_OINTMENT | Freq: Four times a day (QID) | CUTANEOUS | 0 refills | Status: DC

## 2022-12-10 NOTE — ED Triage Notes (Signed)
Sx started sat nigh. 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 she noticed her neck itching that night but didn't see anything. Woke up Sunday morning with a rash on her right arm neck,shoulder and back. Patient states that the rash itches and burns, arm feels like its swelling.

## 2022-12-10 NOTE — Discharge Instructions (Signed)
Stop by the pharmacy to pick up your prescription. Take Zrytec or Claritin as needed for itching.   Benadryl can help you sleep at night.  Wash and dry on hight heat all bed linens.

## 2022-12-10 NOTE — ED Provider Notes (Signed)
[facility]    CSN: [id number] Arrival date & time: 12/10/22  6295      History   Chief Complaint Chief Complaint  Patient presents with   Rash    HPI Janice Fry is a 22 y.o. female.   HPI  Janice Fry presents for rash that started on Saturday night after going through a patient's bag while at work.  She had itching on her neck when she left work.  She has tightness in her right arm. Sunday, put some cortisone cream on it which helped with the itching.     Janice Fry has otherwise been well and has no other concerns.    Past Medical History:  Diagnosis Date   Asthma    GERD (gastroesophageal reflux disease)    Seasonal allergies     There are no problems to display for this patient.   Past Surgical History:  Procedure Laterality Date   BACK SURGERY      OB History   No obstetric history on file.      Home Medications    Prior to Admission medications   Medication Sig Start Date End Date Taking? Authorizing Provider  albuterol (VENTOLIN HFA) 108 (90 Base) MCG/ACT inhaler Inhale 2 puffs into the lungs every 6 (six) hours as needed for wheezing or shortness of breath. 02/21/20  Yes Chesley Noon, MD  hydrOXYzine (ATARAX/VISTARIL) 10 MG tablet Take 1 tablet (10 mg total) by mouth every 8 (eight) hours as needed. 01/06/20  Yes Bast, Traci A, NP  loratadine (CLARITIN) 10 MG tablet Take 1 tablet by mouth daily. 07/11/22 07/11/23 Yes [provider]  montelukast (SINGULAIR) 10 MG tablet Take 1 tablet by mouth daily. 07/26/21  Yes [provider]  triamcinolone ointment (KENALOG) 0.1 % Apply 1 Application topically 4 (four) times daily. 12/10/22  Yes Janice Fry, Seward Meth, DO  acetaminophen (TYLENOL) 500 MG tablet Take 1 tablet (500 mg total) by mouth every 6 (six) hours as needed. 12/12/18   Lamptey, Britta Mccreedy, MD  cyclobenzaprine (FLEXERIL) 10 MG tablet Take 1 tablet (10 mg total) by mouth 2 (two) times daily as needed for muscle spasms. 03/12/19    Mickie Bail, NP  famotidine (PEPCID) 20 MG tablet Take 1 tablet by mouth 2 (two) times daily. 07/11/22 07/11/23  [provider]  fluticasone (FLONASE) 50 MCG/ACT nasal spray Place 1 spray into both nostrils in the morning and at bedtime. 08/29/22   Blitch, Linde Gillis, NP  ibuprofen (ADVIL) 800 MG tablet Take 1 tablet (800 mg total) by mouth every 8 (eight) hours as needed. 03/12/19   Mickie Bail, NP  ipratropium (ATROVENT) 0.06 % nasal spray Place 2 sprays into both nostrils 4 (four) times daily. 08/29/22   Blitch, Linde Gillis, NP  ondansetron (ZOFRAN ODT) 4 MG disintegrating tablet Take 1 tablet (4 mg total) by mouth every 8 (eight) hours as needed for nausea or vomiting. 01/06/20   Dahlia Byes A, NP  pantoprazole (PROTONIX) 40 MG tablet Take 1 tablet (40 mg total) by mouth daily. 07/27/18 03/19/22  Chinita Pester, FNP  predniSONE (DELTASONE) 50 MG tablet One q am x 5 days for bronchial inflammation 02/02/20   Rodriguez-Southworth, Nettie Elm, PA-C  pseudoephedrine (SUDAFED) 60 MG tablet Take 1 tablet (60 mg total) by mouth every 6 (six) hours as needed for congestion. 08/29/22   Blitch, Linde Gillis, NP  cetirizine (ZYRTEC) 10 MG tablet Take 10 mg by mouth daily.  12/12/18  [provider]    Family  History Family History  Problem Relation Age of Onset   Healthy Mother    Healthy Father     Social History Social History   Tobacco Use   Smoking status: Never    Passive exposure: Never   Smokeless tobacco: Never  Vaping Use   Vaping status: Never Used  Substance Use Topics   Alcohol use: No   Drug use: Not Currently    Types: Marijuana    Comment: last use 2020     Allergies   Amoxicillin, Strawberry extract, Bee pollen, Penicillins, Nickel, Peanut oil, and Penicillin g   Review of Systems Review of Systems :negative unless otherwise stated in HPI.      Physical Exam Triage Vital Signs ED Triage Vitals  Encounter Vitals Group     BP 12/10/22 1002 116/84      Systolic BP Percentile --      Diastolic BP Percentile --      Pulse Rate 12/10/22 1002 63     Resp 12/10/22 1002 19     Temp 12/10/22 1002 98.3 F (36.8 C)     Temp Source 12/10/22 1002 Oral     SpO2 12/10/22 1002 97 %     Weight --      Height --      Head Circumference --      Peak Flow --      Pain Score 12/10/22 1000 7     Pain Loc --      Pain Education --      Exclude from Growth Chart --    No data found.  Updated Vital Signs BP 116/84 (BP Location: Right Arm)   Pulse 63   Temp 98.3 F (36.8 C) (Oral)   Resp 19   LMP 12/01/2022 (Approximate)   SpO2 97%   Visual Acuity Right Eye Distance:   Left Eye Distance:   Bilateral Distance:    Right Eye Near:   Left Eye Near:    Bilateral Near:     Physical Exam  GEN: alert, well appearing female, in no acute distress  RESP: no increased work of breathing MSK: no extremity edema, good ROM of bilateral UE SKIN: warm and dry; erythematous patches and papules on right forearm, right anterior chest and left neck     UC Treatments / Results  Labs (all labs ordered are listed, but only abnormal results are displayed) Labs Reviewed - No data to display  EKG   Radiology No results found.  Procedures Procedures (including critical care time)  Medications Ordered in UC Medications - No data to display  Initial Impression / Assessment and Plan / UC Course  I have reviewed the triage vital signs and the nursing notes.  Pertinent labs & imaging results that were available during my care of the patient were reviewed by me and considered in my medical decision making (see chart for details).     Patient is a 22 y.o. femalewho presents for pruritic rash.  Overall, patient is well-appearing and well-hydrated.  Vital signs stable.  Janice Fry is afebrile.  History and exam concerning for bug bites.  Treat with steroid ointment. No sign of infection to suggest antibiotics at this time.  Motrin for discomfort.  Zyrtec  and Claritin for itching.  Benadryl can help her sleep.  I am concerned that she may have been exposed to bedbugs.  Advised her to wash her bed linens and dry them on high heat as well as staining her couch.  Reviewed expectations regarding course of current medical issues.  All questions asked were answered.  Outlined signs and symptoms indicating need for more acute intervention. Patient verbalized understanding. After Visit Summary given.   Final Clinical Impressions(s) / UC Diagnoses   Final diagnoses:  Rash and nonspecific skin eruption     Discharge Instructions      Stop by the pharmacy to pick up your prescription. Take Zrytec or Claritin as needed for itching.   Benadryl can help you sleep at night.  Wash and dry on hight heat all bed linens.       ED Prescriptions     Medication Sig Dispense Auth. Provider   triamcinolone ointment (KENALOG) 0.1 % Apply 1 Application topically 4 (four) times daily. 15 g Katha Cabal, DO      PDMP not reviewed this encounter.              Katha Cabal, DO 12/10/22 1031

## 2023-02-25 ENCOUNTER — Encounter: Payer: Self-pay | Admitting: Emergency Medicine

## 2023-02-25 ENCOUNTER — Ambulatory Visit (INDEPENDENT_AMBULATORY_CARE_PROVIDER_SITE_OTHER): Payer: Medicaid Other

## 2023-02-25 ENCOUNTER — Ambulatory Visit
Admission: RE | Admit: 2023-02-25 | Discharge: 2023-02-25 | Disposition: A | Discharge status: Home / Self Care | Payer: Self-pay | Source: Ambulatory Visit | Attending: Emergency Medicine | Admitting: Emergency Medicine

## 2023-02-25 VITALS — BP 117/81 | HR 77 | Temp 98.7°F | Resp 18

## 2023-02-25 DIAGNOSIS — M25561 Pain in right knee: Secondary | ICD-10-CM

## 2023-02-25 MED ORDER — IBUPROFEN 800 MG PO TABS
800.0000 mg | ORAL_TABLET | Freq: Once | ORAL | Status: AC
  Administered 2023-02-25: 800 mg via ORAL

## 2023-02-25 MED ORDER — IBUPROFEN 600 MG PO TABS: 600.0000 mg | ORAL_TABLET | Freq: Four times a day (QID) | ORAL | 0 refills | Status: DC | PRN

## 2023-02-25 MED ORDER — ACETAMINOPHEN 325 MG PO TABS
975.0000 mg | ORAL_TABLET | Freq: Once | ORAL | Status: AC
  Administered 2023-02-25: 975 mg via ORAL

## 2023-02-25 NOTE — Discharge Instructions (Signed)
 I did not appreciate any fracture on your x-ray.  We will contact you if the radiology overread differs from mine and we need to change management.  In the meantime, take ibuprofen 600 mg, 1000 mg of Tylenol 4 times a day, wear the knee brace and use the crutches as needed for comfort.  Ice.  Follow-up with Dr. Terryon Pineiro Royalty, sports medicine if not better in a week to 10 days.

## 2023-02-25 NOTE — ED Triage Notes (Signed)
 Patient presents with c/o right knee discomfort. Patient states she fell two days ago.

## 2023-02-25 NOTE — ED Provider Notes (Signed)
 HPI  SUBJECTIVE:  Janice Fry is a 23 y.o. female who presents with anterior knee pain after falling on top of a wire dog cage 2 days ago.  Patient does not remember the exact mechanism of the fall, states that she was drinking at the time.  She denies immediate pain, but woke up with the next day.  She describes the pain as sharp, dull, constant.  She states her knee has given way because of the pain and that it is popping.  She is difficulty weightbearing and states that her knee feels as if it is getting stiffer.  No bruising, swelling, erythema.  She has tried ice, Tylenol, elevation, walking on it and active range of motion without improvement in her symptoms.  Symptoms worse with weightbearing and with bending her knee.  She states this feels like the time she fractured her foot.  She has a hyperextension injury to her right knee with no residual injury and a history of reactive airway disease.  LMP: Now.  Denies the possibility of being pregnant.  PCP: [facility].    Past Medical History:  Diagnosis Date   Asthma    GERD (gastroesophageal reflux disease)    Seasonal allergies     Past Surgical History:  Procedure Laterality Date   BACK SURGERY      Family History  Problem Relation Age of Onset   Healthy Mother    Healthy Father     Social History   Tobacco Use   Smoking status: Never    Passive exposure: Never   Smokeless tobacco: Never  Vaping Use   Vaping status: Never Used  Substance Use Topics   Alcohol use: No   Drug use: Not Currently    Types: Marijuana    Comment: last use 2020    No current facility-administered medications for this encounter.  Current Outpatient Medications:    ibuprofen (ADVIL) 600 MG tablet, Take 1 tablet (600 mg total) by mouth every 6 (six) hours as needed., Disp: 30 tablet, Rfl: 0   albuterol (VENTOLIN HFA) 108 (90 Base) MCG/ACT inhaler, Inhale 2 puffs into the lungs every 6 (six) hours as needed for wheezing or  shortness of breath., Disp: 8 g, Rfl: 0   famotidine (PEPCID) 20 MG tablet, Take 1 tablet by mouth 2 (two) times daily., Disp: , Rfl:    fluticasone (FLONASE) 50 MCG/ACT nasal spray, Place 1 spray into both nostrils in the morning and at bedtime., Disp: 18.2 mL, Rfl: 1   hydrOXYzine (ATARAX/VISTARIL) 10 MG tablet, Take 1 tablet (10 mg total) by mouth every 8 (eight) hours as needed., Disp: 30 tablet, Rfl: 0   ipratropium (ATROVENT) 0.06 % nasal spray, Place 2 sprays into both nostrils 4 (four) times daily., Disp: 15 mL, Rfl: 12   loratadine (CLARITIN) 10 MG tablet, Take 1 tablet by mouth daily., Disp: , Rfl:    montelukast (SINGULAIR) 10 MG tablet, Take 1 tablet by mouth daily., Disp: , Rfl:    pantoprazole (PROTONIX) 40 MG tablet, Take 1 tablet (40 mg total) by mouth daily., Disp: 30 tablet, Rfl: 0   triamcinolone ointment (KENALOG) 0.1 %, Apply 1 Application topically 4 (four) times daily., Disp: 15 g, Rfl: 0  Allergies  Allergen Reactions   Amoxicillin Rash   Strawberry Extract Itching and Rash   Bee Pollen Other (See Comments)   Penicillins    Nickel Rash   Peanut Oil Rash   Penicillin G Rash     ROS  As noted in HPI.   Physical Exam  BP 117/81 (BP Location: Left Arm)   Pulse 77   Temp 98.7 F (37.1 C) (Oral)   Resp 18   LMP 02/22/2023   SpO2 95%   Constitutional: Well developed, well nourished, appears uncomfortable.  Antalgic gait.  Difficulty with weightbearing. Eyes:  EOMI, conjunctiva normal bilaterally HENT: Normocephalic, atraumatic,mucus membranes moist Respiratory: Normal inspiratory effort Cardiovascular: Normal rate GI: nondistended skin: No rash, skin intact Musculoskeletal: no deformities R  Knee ROM decreased due to pain, Flexion  intact, Patella tender, Patellar tendon tender, tibial tubercle tender, Medial joint tender, Lateral joint NT, Popliteal region NT, Varus MCL stress testing stable, Valgus LCL stress testing stable, ACL/PCL stable, McMurray  positive,  Distal NVI with intact baseline sensation / motor / pulse distal to knee.  No effusion. No erythema. No increased temperature. No crepitus.  No bruising. Neurologic: Alert & oriented x 3, no focal neuro deficits Psychiatric: Speech and behavior appropriate   ED Course   Medications  acetaminophen (TYLENOL) tablet 975 mg (975 mg Oral Given 02/25/23 0948)  ibuprofen (ADVIL) tablet 800 mg (800 mg Oral Given 02/25/23 0948)    Orders Placed This Encounter  Procedures   DG Knee Complete 4 Views Right    Standing Status:   Standing    Number of Occurrences:   1    Reason for Exam (SYMPTOM  OR DIAGNOSIS REQUIRED):   Fall, right knee pain, tenderness along patella and insertion of patellar tendon rule out fracture   AMB referral to sports medicine    Referral Priority:   Routine    Referral Type:   Consultation    Referral Reason:   Specialty Services Required    Referred to Provider:   Jerrol Banana, MD    Number of Visits Requested:   1   Crutches    Standing Status:   Standing    Number of Occurrences:   1   Apply knee brace/sleeve    Standing Status:   Standing    Number of Occurrences:   1    Laterality:   Right    No results found for this or any previous visit (from the past 24 hours). DG Knee Complete 4 Views Right Result Date: 02/25/2023 CLINICAL DATA:  Pain after fall EXAM: RIGHT KNEE - COMPLETE 5 VIEW COMPARISON:  None Available. FINDINGS: No evidence of fracture, dislocation, or joint effusion. No evidence of arthropathy or other focal bone abnormality. Soft tissues are unremarkable. IMPRESSION: No acute osseous abnormality. Electronically Signed   By: Karen Kays M.D.   On: 02/25/2023 11:08    ED Clinical Impression  1. Acute pain of right knee      ED Assessment/Plan     Patient presents with acute knee pain after having a fall.  She has tenderness over the patella and at the tibial tubercle.  Will x-ray knee.  Giving Tylenol 975 and ibuprofen 800  mg p.o. here.  Reviewed imaging independently.  No fracture, effusion.  Will contact patient at [phone] if radiology overread differs from mine and we need to change management.   No appreciable fracture on x-ray.  Placing her in a knee brace, crutches, will send home with Tylenol combined with ibuprofen 3 times daily, ice.  Follow-up with orthopedics or sports medicine.  Work note for today and tomorrow.  Reviewed radiology report.  No fracture, effusion consistent with my read.  See radiology report for full details  Discussed  imaging,  MDM, treatment plan, and plan for follow-up with patient. patient agrees with plan.   Meds ordered this encounter  Medications   acetaminophen (TYLENOL) tablet 975 mg   ibuprofen (ADVIL) tablet 800 mg   ibuprofen (ADVIL) 600 MG tablet    Sig: Take 1 tablet (600 mg total) by mouth every 6 (six) hours as needed.    Dispense:  30 tablet    Refill:  0      *This clinic note was created using Scientist, clinical (histocompatibility and immunogenetics). Therefore, there may be occasional mistakes despite careful proofreading.  ?    Domenick Gong, MD 02/25/23 1116

## 2023-07-13 ENCOUNTER — Ambulatory Visit: Payer: Self-pay

## 2023-07-14 ENCOUNTER — Encounter: Payer: Self-pay | Admitting: Emergency Medicine

## 2023-07-14 ENCOUNTER — Ambulatory Visit
Admission: EM | Admit: 2023-07-14 | Discharge: 2023-07-14 | Disposition: A | Discharge status: Home / Self Care | Attending: Emergency Medicine | Admitting: Emergency Medicine

## 2023-07-14 DIAGNOSIS — M545 Low back pain, unspecified: Secondary | ICD-10-CM | POA: Diagnosis present

## 2023-07-14 LAB — URINALYSIS, W/ REFLEX TO CULTURE (INFECTION SUSPECTED)
Bilirubin Urine: NEGATIVE
Glucose, UA: NEGATIVE mg/dL
Hgb urine dipstick: NEGATIVE
Ketones, ur: NEGATIVE mg/dL
Leukocytes,Ua: NEGATIVE
Nitrite: NEGATIVE
Protein, ur: NEGATIVE mg/dL
Specific Gravity, Urine: 1.015 (ref 1.005–1.030)
pH: 7 (ref 5.0–8.0)

## 2023-07-14 MED ORDER — IBUPROFEN 600 MG PO TABS: 600.0000 mg | ORAL_TABLET | Freq: Four times a day (QID) | ORAL | 0 refills | Status: DC | PRN

## 2023-07-14 MED ORDER — BACLOFEN 10 MG PO TABS: 10.0000 mg | ORAL_TABLET | Freq: Three times a day (TID) | ORAL | 0 refills | Status: DC

## 2023-07-14 NOTE — ED Provider Notes (Signed)
 [facility]    CSN: [id number] Arrival date & time: 07/14/23  1047      History   Chief Complaint Chief Complaint  Patient presents with   Back Pain    HPI Janice Fry is a 23 y.o. female.   HPI  23 year old female with past medical history significant for GERD, seasonal allergies, and asthma presents for evaluation of 1 week worth of low back pain.  No injuries, falls, or heavy lifting.  She denies any urinary symptoms.  She does report that she recently switched from a pillow top bed to a more conventional mattress and is unsure if this is what is caused the pain.  She has taken Tylenol  without relief.  She denies urinary symptoms.  Past Medical History:  Diagnosis Date   Asthma    GERD (gastroesophageal reflux disease)    Seasonal allergies     There are no active problems to display for this patient.   Past Surgical History:  Procedure Laterality Date   BACK SURGERY      OB History   No obstetric history on file.      Home Medications    Prior to Admission medications   Medication Sig Start Date End Date Taking? Authorizing Provider  baclofen  (LIORESAL ) 10 MG tablet Take 1 tablet (10 mg total) by mouth 3 (three) times daily. 07/14/23  Yes Bernardino Ditch, NP  ibuprofen  (ADVIL ) 600 MG tablet Take 1 tablet (600 mg total) by mouth every 6 (six) hours as needed. 07/14/23  Yes Bernardino Ditch, NP  montelukast (SINGULAIR) 10 MG tablet Take 1 tablet by mouth daily. 07/26/21  Yes [provider]  pantoprazole  (PROTONIX ) 40 MG tablet Take 1 tablet (40 mg total) by mouth daily. 07/27/18 07/14/23 Yes Triplett, Cari B, FNP  albuterol  (VENTOLIN  HFA) 108 (90 Base) MCG/ACT inhaler Inhale 2 puffs into the lungs every 6 (six) hours as needed for wheezing or shortness of breath. 02/21/20   Willo Dunnings, MD  fluticasone  (FLONASE ) 50 MCG/ACT nasal spray Place 1 spray into both nostrils in the morning and at bedtime. 08/29/22   Blitch, Marval HERO, NP  cetirizine  (ZYRTEC) 10 MG tablet Take 10 mg by mouth daily.  12/12/18  [provider]    Family History Family History  Problem Relation Age of Onset   Healthy Mother    Healthy Father     Social History Social History   Tobacco Use   Smoking status: Never    Passive exposure: Never   Smokeless tobacco: Never  Vaping Use   Vaping status: Never Used  Substance Use Topics   Alcohol use: No   Drug use: Not Currently    Types: Marijuana    Comment: last use 2020     Allergies   Amoxicillin, Strawberry extract, Bee pollen, Penicillins, Nickel, Peanut oil, and Penicillin g   Review of Systems Review of Systems  Constitutional:  Negative for fever.  Genitourinary:  Negative for dysuria, frequency and urgency.  Musculoskeletal:  Positive for back pain.     Physical Exam Triage Vital Signs ED Triage Vitals  Encounter Vitals Group     BP      Girls Systolic BP Percentile      Girls Diastolic BP Percentile      Boys Systolic BP Percentile      Boys Diastolic BP Percentile      Pulse      Resp      Temp      Temp  src      SpO2      Weight      Height      Head Circumference      Peak Flow      Pain Score      Pain Loc      Pain Education      Exclude from Growth Chart    No data found.  Updated Vital Signs BP 121/85 (BP Location: Right Arm)   Pulse 72   Temp 97.9 F (36.6 C) (Oral)   Resp 15   Ht 5' 4 (1.626 m)   Wt 220 lb 0.3 oz (99.8 kg)   LMP 07/08/2023 (Approximate)   SpO2 100%   BMI 37.77 kg/m   Visual Acuity Right Eye Distance:   Left Eye Distance:   Bilateral Distance:    Right Eye Near:   Left Eye Near:    Bilateral Near:     Physical Exam Vitals and nursing note reviewed.  Constitutional:      Appearance: Normal appearance. She is not ill-appearing.  HENT:     Head: Normocephalic and atraumatic.  Musculoskeletal:        General: Tenderness present. No signs of injury.  Skin:    General: Skin is warm and dry.     Capillary  Refill: Capillary refill takes less than 2 seconds.     Findings: No rash.  Neurological:     General: No focal deficit present.     Mental Status: She is alert and oriented to person, place, and time.      UC Treatments / Results  Labs (all labs ordered are listed, but only abnormal results are displayed) Labs Reviewed  URINALYSIS, W/ REFLEX TO CULTURE (INFECTION SUSPECTED) - Abnormal; Notable for the following components:      Result Value   Bacteria, UA FEW (*)    All other components within normal limits    EKG   Radiology No results found.  Procedures Procedures (including critical care time)  Medications Ordered in UC Medications - No data to display  Initial Impression / Assessment and Plan / UC Course  I have reviewed the triage vital signs and the nursing notes.  Pertinent labs & imaging results that were available during my care of the patient were reviewed by me and considered in my medical decision making (see chart for details).   Patient is a nontoxic-appearing 23 year old female presenting for evaluation of 1 week worth of low back pain without associated injury, straining, or urinary symptoms.  A urinalysis was collected at triage and is completely benign without evidence of infection.  Physical exam does not demonstrate any midline spinous process tenderness or step-off.  She does have mild muscle tension in the lower lumbar paraspinous region bilaterally.  No overt spasm.  The patient reports that the pain does not increase with flexion or extension of her back nor with lateral rotation.  I do suspect that it is musculoskeletal in nature and may stem from the recent change in her mattress.  I will treat her for musculoskeletal back pain with ibuprofen  600 mg every 6 hours along with baclofen  10 mg every 8 hours.  We also discussed using moist heat and home physical therapy exercises.  Return precautions reviewed   Final Clinical Impressions(s) / UC Diagnoses    Final diagnoses:  Acute bilateral low back pain without sciatica     Discharge Instructions      Take the ibuprofen , 600 mg every 6  hours with food, on a schedule for the next 48 hours and then as needed.  Take the baclofen , 10 mg every 8 hours, on a schedule for the next 48 hours and then as needed.  Apply moist heat to your back for 30 minutes at a time 2-3 times a day to improve blood flow to the area and help remove the lactic acid causing the spasm.  Follow the back exercises given at discharge.  Return for reevaluation for any new or worsening symptoms.      ED Prescriptions     Medication Sig Dispense Auth. Provider   baclofen  (LIORESAL ) 10 MG tablet Take 1 tablet (10 mg total) by mouth 3 (three) times daily. 30 each Bernardino Ditch, NP   ibuprofen  (ADVIL ) 600 MG tablet Take 1 tablet (600 mg total) by mouth every 6 (six) hours as needed. 30 tablet Bernardino Ditch, NP      PDMP not reviewed this encounter.   Bernardino Ditch, NP 07/14/23 1222

## 2023-07-14 NOTE — Discharge Instructions (Addendum)

## 2023-07-14 NOTE — ED Triage Notes (Signed)
 Patient c/o lower back pain for a week.  Patient denies any injury or fall.  Patient denies any urinary symptoms.

## 2024-01-21 ENCOUNTER — Ambulatory Visit

## 2024-01-21 ENCOUNTER — Ambulatory Visit
Admission: EM | Admit: 2024-01-21 | Discharge: 2024-01-21 | Disposition: A | Discharge status: Home / Self Care | Attending: Emergency Medicine | Admitting: Emergency Medicine

## 2024-01-21 DIAGNOSIS — M79674 Pain in right toe(s): Secondary | ICD-10-CM | POA: Diagnosis not present

## 2024-01-21 DIAGNOSIS — S91209A Unspecified open wound of unspecified toe(s) with damage to nail, initial encounter: Secondary | ICD-10-CM | POA: Diagnosis not present

## 2024-01-21 MED ORDER — DOXYCYCLINE HYCLATE 100 MG PO CAPS: 100.0000 mg | ORAL_CAPSULE | Freq: Two times a day (BID) | ORAL | 0 refills | Status: AC

## 2024-01-21 MED ORDER — IBUPROFEN 600 MG PO TABS: 600.0000 mg | ORAL_TABLET | Freq: Four times a day (QID) | ORAL | 0 refills | Status: AC | PRN

## 2024-01-21 MED ORDER — CHLORHEXIDINE GLUCONATE 4 % EX SOLN: Freq: Every day | CUTANEOUS | 0 refills | Status: AC | PRN

## 2024-01-21 NOTE — ED Triage Notes (Signed)
 Patient to Urgent Care with complaints of right sided big toe pain. Reports she rolled a dialysis machine on her toe today.

## 2024-01-21 NOTE — Discharge Instructions (Signed)
 I did not appreciate any fracture on your x-ray.  We will contact you if the radiology overread differs enough from mine and we need to change management.  Keep this clean with Hibiclens  soap and water, and wrap it up after you clean it, doxycycline  100 mg p.o. twice daily for 5 days as prophylaxis.  600 mg of ibuprofen  combined with 1000 mg of Tylenol  together 3-4 times a day as needed for pain.

## 2024-01-21 NOTE — ED Provider Notes (Signed)
 HPI  SUBJECTIVE:  Janice Fry is a 24 y.o. female who presents with right big toe pain, bleeding after rolling a dialysis machine over while wearing soft shoes today at work.  She reports bleeding from the site of the nail and now serous drainage from underneath the toenail.  She just grabs diffuse, throbbing, sharp, constant pain, swelling, limitation of motion secondary to the pain.  She denies injury to the foot or bruising.  She tried Tylenol , wrapping it up and gauze and tape without improvement in her symptoms.  Symptoms worse with walking and with palpation.  She has no past medical history.  LMP: Now.  Denies the possibility of being pregnant.  PCP: [facility].  This is not a Financial Risk Analyst case.  Past Medical History:  Diagnosis Date   Asthma    GERD (gastroesophageal reflux disease)    Seasonal allergies     Past Surgical History:  Procedure Laterality Date   BACK SURGERY      Family History  Problem Relation Age of Onset   Healthy Mother    Healthy Father     Social History[1]  Current Medications[2]  Allergies[3]   ROS  As noted in HPI.   Physical Exam  BP 132/85   Pulse 76   Temp 98 F (36.7 C)   Resp 18   Wt 103.1 kg   LMP 01/19/2024   SpO2 100%   BMI 39.00 kg/m   Constitutional: Well developed, well nourished, no acute distress Eyes:  EOMI, conjunctiva normal bilaterally HENT: Normocephalic, atraumatic,mucus membranes moist Respiratory: Normal inspiratory effort Cardiovascular: Normal rate GI: nondistended skin: No rash, skin intact Musculoskeletal: Right foot: No tenderness over the metatarsals.  No tenderness at the first MTP joint.  Tenderness at the proximal phalanx and PIP, distal phalanx of the toe.  Skin intact.  Nail slightly loose, but intact.  No subungual hematoma.  Serous drainage from underneath nail.  Cap refill less than 2 seconds.  Patient able to bend toe.     Neurologic: Alert & oriented x 3, no focal  neuro deficits Psychiatric: Speech and behavior appropriate   ED Course   Medications - No data to display  Orders Placed This Encounter  Procedures   DG Toe Great Right    Standing Status:   Standing    Number of Occurrences:   1    Reason for Exam (SYMPTOM  OR DIAGNOSIS REQUIRED):   Crush injury.  Rule out fracture.   Apply dressing    Please apply dressing and give patient some nonstick gauze, roll gauze and Coban or tape to take home    Standing Status:   Standing    Number of Occurrences:   1   Post op shoe    Standing Status:   Standing    Number of Occurrences:   1    Laterality:   Right   Buddy tape toes    Standing Status:   Standing    Number of Occurrences:   1    Laterality:   Right    No results found for this or any previous visit (from the past 24 hours). DG Toe Great Right Result Date: 01/21/2024 EXAM: XRAY OF THE RIGHT GREAT TOE AND 1ST METATARSAL, 3 VIEWS 01/21/2024 08:14:54 PM COMPARISON: None available. CLINICAL HISTORY: Crush injury. Rule out fracture. FINDINGS: BONES AND JOINTS: There is a tiny density along the medial edge of the proximal metaphysis of the great toe distal phalanx, seen only on  the second image, which could represent a tiny chip fracture. No evidence of fractures elsewhere. No dislocation. Arthritic changes are not seen. Moderate hallux valgus with otherwise normal alignment. SOFT TISSUES: Mild generalized soft tissue swelling in the great toe. TECHNICAL NOTE: A true lateral view was not provided. The attempted lateral is more oblique than it is lateral. IMPRESSION: 1. Possible tiny chip fracture along the medial edge of the proximal metaphysis of the great toe distal phalanx. 2. Mild generalized soft tissue swelling without further evidence of fractures. 3. Hallux valgus. Electronically signed by: Francis Quam MD 01/21/2024 08:26 PM EST RP Workstation: [id number]    ED Clinical Impression  1. Avulsion of toenail, initial encounter   2.  Great toe pain, right      ED Assessment/Plan    Patient consented to the use of clinical photography.  Reviewed imaging independently.  Possible tiny chip fracture along the medial edge of the proximal metaphysis of the distal phalanx seen only on 1 view.   see radiology report for full details.  I suspect that the dialysis machine pulled her nail back, causing it to bleed.  It is now leaking serous fluid.  However, x-ray is negative for significant fracture.  There may be a tiny chip fracture, which does not change management.  Discussed x-ray findings with patient while in department..  Home with Hibiclens , doxycycline  100 mg p.o. twice daily for 5 days as prophylaxis.  She reports hives with penicillin/amoxicillin and maybe angioedema with Keflex.  She is not sure.  Ibuprofen  combined with Tylenol  3-4 times a day.  Work note.  Having staff cleaned patient's toe, wrapped, then buddy tape.  Placing in a stiff soled shoe.  Discussed  imaging, MDM, treatment plan, and plan for follow-up with patient. Dipatient agrees with plan.   Meds ordered this encounter  Medications   doxycycline  (VIBRAMYCIN ) 100 MG capsule    Sig: Take 1 capsule (100 mg total) by mouth 2 (two) times daily for 5 days.    Dispense:  10 capsule    Refill:  0   ibuprofen  (ADVIL ) 600 MG tablet    Sig: Take 1 tablet (600 mg total) by mouth every 6 (six) hours as needed.    Dispense:  30 tablet    Refill:  0   chlorhexidine  (HIBICLENS ) 4 % external liquid    Sig: Apply topically daily as needed.    Dispense:  120 mL    Refill:  0      *This clinic note was created using Scientist, clinical (histocompatibility and immunogenetics). Therefore, there may be occasional mistakes despite careful proofreading.  ?     [1]  Social History Tobacco Use   Smoking status: Never    Passive exposure: Never   Smokeless tobacco: Never  Vaping Use   Vaping status: Never Used  Substance Use Topics   Alcohol use: No   Drug use: Not Currently    Types:  Marijuana    Comment: last use 2020  [2] No current facility-administered medications for this encounter.  Current Outpatient Medications:    chlorhexidine  (HIBICLENS ) 4 % external liquid, Apply topically daily as needed., Disp: 120 mL, Rfl: 0   doxycycline  (VIBRAMYCIN ) 100 MG capsule, Take 1 capsule (100 mg total) by mouth 2 (two) times daily for 5 days., Disp: 10 capsule, Rfl: 0   ibuprofen  (ADVIL ) 600 MG tablet, Take 1 tablet (600 mg total) by mouth every 6 (six) hours as needed., Disp: 30 tablet, Rfl: 0   albuterol  (VENTOLIN   HFA) 108 (90 Base) MCG/ACT inhaler, Inhale 2 puffs into the lungs every 6 (six) hours as needed for wheezing or shortness of breath., Disp: 8 g, Rfl: 0   fluticasone  (FLONASE ) 50 MCG/ACT nasal spray, Place 1 spray into both nostrils in the morning and at bedtime., Disp: 18.2 mL, Rfl: 1   montelukast (SINGULAIR) 10 MG tablet, Take 1 tablet by mouth daily., Disp: , Rfl:    pantoprazole  (PROTONIX ) 40 MG tablet, Take 1 tablet (40 mg total) by mouth daily., Disp: 30 tablet, Rfl: 0 [3]  Allergies Allergen Reactions   Amoxicillin Rash   Strawberry Extract Itching and Rash   Bee Pollen Other (See Comments)   Penicillins    Nickel Rash   Peanut Oil Rash   Penicillin G Rash     Van Knee, MD 01/23/24 1220
